# Patient Record
Sex: Female | Born: 1950 | Race: Black or African American | Hispanic: No | Smoking: Never smoker
Health system: Southern US, Community
[De-identification: ages and names within clinical notes are randomized; demographics above are authoritative.]

## PROBLEM LIST (undated history)

## (undated) DIAGNOSIS — I10 Essential (primary) hypertension: Secondary | ICD-10-CM

## (undated) DIAGNOSIS — I161 Hypertensive emergency: Secondary | ICD-10-CM

## (undated) DIAGNOSIS — F419 Anxiety disorder, unspecified: Secondary | ICD-10-CM

## (undated) DIAGNOSIS — F329 Major depressive disorder, single episode, unspecified: Secondary | ICD-10-CM

## (undated) DIAGNOSIS — E119 Type 2 diabetes mellitus without complications: Secondary | ICD-10-CM

## (undated) DIAGNOSIS — H547 Unspecified visual loss: Secondary | ICD-10-CM

## (undated) DIAGNOSIS — F32A Depression, unspecified: Secondary | ICD-10-CM

## (undated) HISTORY — DX: Hypertensive emergency: I16.1

## (undated) HISTORY — DX: Depression, unspecified: F32.A

## (undated) HISTORY — PX: EYE SURGERY: SHX253

## (undated) HISTORY — DX: Unspecified visual loss: H54.7

## (undated) HISTORY — DX: Anxiety disorder, unspecified: F41.9

---
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
## 2013-04-22 DIAGNOSIS — H4311 Vitreous hemorrhage, right eye: Secondary | ICD-10-CM | POA: Insufficient documentation

## 2013-04-22 HISTORY — DX: Vitreous hemorrhage, right eye: H43.11

## 2013-12-31 DIAGNOSIS — E1139 Type 2 diabetes mellitus with other diabetic ophthalmic complication: Secondary | ICD-10-CM

## 2013-12-31 HISTORY — DX: Type 2 diabetes mellitus with other diabetic ophthalmic complication: E11.39

## 2016-06-25 DIAGNOSIS — E113519 Type 2 diabetes mellitus with proliferative diabetic retinopathy with macular edema, unspecified eye: Secondary | ICD-10-CM | POA: Insufficient documentation

## 2017-02-14 DIAGNOSIS — H3341 Traction detachment of retina, right eye: Secondary | ICD-10-CM | POA: Insufficient documentation

## 2017-02-14 HISTORY — DX: Traction detachment of retina, right eye: H33.41

## 2017-05-05 DIAGNOSIS — H2510 Age-related nuclear cataract, unspecified eye: Secondary | ICD-10-CM | POA: Insufficient documentation

## 2017-09-08 DIAGNOSIS — H2510 Age-related nuclear cataract, unspecified eye: Secondary | ICD-10-CM | POA: Insufficient documentation

## 2018-05-21 DIAGNOSIS — E11311 Type 2 diabetes mellitus with unspecified diabetic retinopathy with macular edema: Secondary | ICD-10-CM | POA: Insufficient documentation

## 2019-01-20 ENCOUNTER — Other Ambulatory Visit: Payer: Self-pay

## 2019-01-20 ENCOUNTER — Encounter: Payer: Self-pay | Admitting: Emergency Medicine

## 2019-01-20 ENCOUNTER — Inpatient Hospital Stay
Admission: EM | Admit: 2019-01-20 | Discharge: 2019-01-23 | DRG: 305 | Disposition: A | Discharge status: Home / Self Care | Payer: Medicare Other | Attending: Internal Medicine | Admitting: Internal Medicine

## 2019-01-20 DIAGNOSIS — E119 Type 2 diabetes mellitus without complications: Secondary | ICD-10-CM | POA: Diagnosis not present

## 2019-01-20 DIAGNOSIS — E860 Dehydration: Secondary | ICD-10-CM | POA: Diagnosis present

## 2019-01-20 DIAGNOSIS — I161 Hypertensive emergency: Secondary | ICD-10-CM | POA: Diagnosis not present

## 2019-01-20 DIAGNOSIS — Z79899 Other long term (current) drug therapy: Secondary | ICD-10-CM

## 2019-01-20 DIAGNOSIS — N179 Acute kidney failure, unspecified: Secondary | ICD-10-CM | POA: Diagnosis not present

## 2019-01-20 DIAGNOSIS — E875 Hyperkalemia: Secondary | ICD-10-CM | POA: Diagnosis present

## 2019-01-20 DIAGNOSIS — Z20828 Contact with and (suspected) exposure to other viral communicable diseases: Secondary | ICD-10-CM | POA: Diagnosis present

## 2019-01-20 DIAGNOSIS — I10 Essential (primary) hypertension: Secondary | ICD-10-CM | POA: Diagnosis present

## 2019-01-20 DIAGNOSIS — H547 Unspecified visual loss: Secondary | ICD-10-CM | POA: Diagnosis present

## 2019-01-20 DIAGNOSIS — Z833 Family history of diabetes mellitus: Secondary | ICD-10-CM

## 2019-01-20 DIAGNOSIS — F419 Anxiety disorder, unspecified: Secondary | ICD-10-CM | POA: Diagnosis present

## 2019-01-20 DIAGNOSIS — Z823 Family history of stroke: Secondary | ICD-10-CM

## 2019-01-20 HISTORY — DX: Essential (primary) hypertension: I10

## 2019-01-20 HISTORY — DX: Acute kidney failure, unspecified: N17.9

## 2019-01-20 HISTORY — DX: Type 2 diabetes mellitus without complications: E11.9

## 2019-01-20 LAB — COMPREHENSIVE METABOLIC PANEL
ALT: 8 U/L (ref 0–44)
AST: 17 U/L (ref 15–41)
Albumin: 4.2 g/dL (ref 3.5–5.0)
Alkaline Phosphatase: 63 U/L (ref 38–126)
Anion gap: 11 (ref 5–15)
BUN: 33 mg/dL — ABNORMAL HIGH (ref 8–23)
CO2: 20 mmol/L — ABNORMAL LOW (ref 22–32)
Calcium: 9.6 mg/dL (ref 8.9–10.3)
Chloride: 103 mmol/L (ref 98–111)
Creatinine, Ser: 1.67 mg/dL — ABNORMAL HIGH (ref 0.44–1.00)
GFR calc Af Amer: 37 mL/min — ABNORMAL LOW (ref >60)
GFR calc non Af Amer: 32 mL/min — ABNORMAL LOW (ref >60)
Glucose, Bld: 213 mg/dL — ABNORMAL HIGH (ref 70–99)
Potassium: 7.4 mmol/L (ref 3.5–5.1)
Sodium: 134 mmol/L — ABNORMAL LOW (ref 135–145)
Total Bilirubin: 0.7 mg/dL (ref 0.3–1.2)
Total Protein: 8.1 g/dL (ref 6.5–8.1)

## 2019-01-20 LAB — BASIC METABOLIC PANEL
Anion gap: 10 (ref 5–15)
BUN: 33 mg/dL — ABNORMAL HIGH (ref 8–23)
CO2: 19 mmol/L — ABNORMAL LOW (ref 22–32)
Calcium: 9 mg/dL (ref 8.9–10.3)
Chloride: 106 mmol/L (ref 98–111)
Creatinine, Ser: 1.64 mg/dL — ABNORMAL HIGH (ref 0.44–1.00)
GFR calc Af Amer: 37 mL/min — ABNORMAL LOW (ref >60)
GFR calc non Af Amer: 32 mL/min — ABNORMAL LOW (ref >60)
Glucose, Bld: 180 mg/dL — ABNORMAL HIGH (ref 70–99)
Potassium: 5.5 mmol/L — ABNORMAL HIGH (ref 3.5–5.1)
Sodium: 135 mmol/L (ref 135–145)

## 2019-01-20 LAB — CBC
HCT: 33.9 % — ABNORMAL LOW (ref 36.0–46.0)
Hemoglobin: 11.1 g/dL — ABNORMAL LOW (ref 12.0–15.0)
MCH: 27.7 pg (ref 26.0–34.0)
MCHC: 32.7 g/dL (ref 30.0–36.0)
MCV: 84.5 fL (ref 80.0–100.0)
Platelets: 294 10*3/uL (ref 150–400)
RBC: 4.01 MIL/uL (ref 3.87–5.11)
RDW: 13.3 % (ref 11.5–15.5)
WBC: 8.2 10*3/uL (ref 4.0–10.5)
nRBC: 0 % (ref 0.0–0.2)

## 2019-01-20 LAB — TROPONIN I (HIGH SENSITIVITY): Troponin I (High Sensitivity): 7 ng/L (ref <18)

## 2019-01-20 LAB — SARS CORONAVIRUS 2 (TAT 6-24 HRS): SARS Coronavirus 2: NEGATIVE

## 2019-01-20 MED ORDER — CALCIUM GLUCONATE-NACL 1-0.675 GM/50ML-% IV SOLN
1.0000 g | Freq: Once | INTRAVENOUS | Status: AC
  Administered 2019-01-20: 1000 mg via INTRAVENOUS
  Filled 2019-01-20: qty 50

## 2019-01-20 MED ORDER — DEXTROSE 50 % IV SOLN
25.0000 g | Freq: Once | INTRAVENOUS | Status: AC
  Administered 2019-01-20: 17:00:00 25 g via INTRAVENOUS

## 2019-01-20 MED ORDER — DEXTROSE 50 % IV SOLN
INTRAVENOUS | Status: AC
  Administered 2019-01-20: 25 g via INTRAVENOUS
  Filled 2019-01-20: qty 50

## 2019-01-20 MED ORDER — SODIUM CHLORIDE 0.9 % IV BOLUS
1000.0000 mL | Freq: Once | INTRAVENOUS | Status: AC
  Administered 2019-01-20: 1000 mL via INTRAVENOUS

## 2019-01-20 MED ORDER — LORAZEPAM 1 MG PO TABS
1.0000 mg | ORAL_TABLET | Freq: Once | ORAL | Status: AC
  Administered 2019-01-20: 1 mg via ORAL
  Filled 2019-01-20: qty 1

## 2019-01-20 MED ORDER — SODIUM CHLORIDE 0.9 % IV SOLN
INTRAVENOUS | Status: DC
  Administered 2019-01-20 – 2019-01-21 (×2): via INTRAVENOUS

## 2019-01-20 MED ORDER — SODIUM BICARBONATE 8.4 % IV SOLN
50.0000 meq | Freq: Once | INTRAVENOUS | Status: AC
  Administered 2019-01-20: 50 meq via INTRAVENOUS
  Filled 2019-01-20: qty 50

## 2019-01-20 MED ORDER — HYDRALAZINE HCL 20 MG/ML IJ SOLN
10.0000 mg | Freq: Once | INTRAMUSCULAR | Status: AC
  Administered 2019-01-20: 10 mg via INTRAVENOUS
  Filled 2019-01-20: qty 1

## 2019-01-20 MED ORDER — CLONIDINE HCL 0.1 MG PO TABS
0.2000 mg | ORAL_TABLET | Freq: Once | ORAL | Status: AC
  Administered 2019-01-20: 0.2 mg via ORAL
  Filled 2019-01-20: qty 2

## 2019-01-20 MED ORDER — LABETALOL HCL 5 MG/ML IV SOLN
10.0000 mg | INTRAVENOUS | Status: DC | PRN
  Administered 2019-01-21: 10 mg via INTRAVENOUS
  Filled 2019-01-20 (×2): qty 4

## 2019-01-20 MED ORDER — INSULIN ASPART 100 UNIT/ML ~~LOC~~ SOLN
10.0000 [IU] | Freq: Once | SUBCUTANEOUS | Status: AC
  Administered 2019-01-20: 10 [IU] via INTRAVENOUS
  Filled 2019-01-20: qty 1
  Filled 2019-01-20: qty 0.1

## 2019-01-20 MED ORDER — PATIROMER SORBITEX CALCIUM 8.4 G PO PACK
16.8000 g | PACK | Freq: Every day | ORAL | Status: DC
  Administered 2019-01-20 – 2019-01-22 (×3): 16.8 g via ORAL
  Filled 2019-01-20 (×5): qty 2

## 2019-01-20 MED ORDER — AMLODIPINE BESYLATE 10 MG PO TABS
10.0000 mg | ORAL_TABLET | Freq: Every day | ORAL | Status: DC
  Administered 2019-01-20 – 2019-01-23 (×4): 10 mg via ORAL
  Filled 2019-01-20 (×3): qty 1
  Filled 2019-01-20: qty 2

## 2019-01-20 NOTE — ED Notes (Signed)
Reports hypertension over last week, takes BP at home. Denies blurry vision or dizziness. Pt alert and oriented X4, cooperative, RR even and unlabored, color WNL. Pt in NAD.

## 2019-01-20 NOTE — ED Notes (Signed)
Date and time results received: 01/20/19 4:32 PM 2 (use smartphrase ".now" to insert current time)  Test: potassium Critical Value: 7.4   Name of Provider Notified: Dr. Harvest Dark   Orders Received? Or Actions Taken?:

## 2019-01-20 NOTE — ED Triage Notes (Signed)
Pt in via POV, reports high blood pressure x a few days.  Reports going to PCP a few days ago, BP high then, medications have been changed but without any relief.  BP 226/85 in triage.    Pt also reports intermittent tingling sensation to left abdomen.  NAD noted at this time.    Pt is legally blind; daughter remains with patient in lobby.

## 2019-01-20 NOTE — ED Provider Notes (Signed)
[facility] Emergency Department Provider Note  Time seen: 4:24 PM  I have reviewed the triage vital signs and the nursing notes.   HISTORY  Chief Complaint Hypertension   HPI Jill Larsen is a 68 y.o. female   with a past medical history of diabetes and hypertension presents to the emergency department for high blood pressure.  According to the patient her blood pressure has been elevated at times greater than 200, states she recently saw her doctor who changed her medications but she is not exactly sure what was changed.  Patient states some tingling in her abdomen at times but denies any weakness or numbness of any arm or leg.  Denies any headache.  Denies any chest pain or shortness of breath.  Past Medical History:  Diagnosis Date  . Diabetes mellitus without complication (HCC)   . Hypertension     There are no active problems to display for this patient.   Past Surgical History:  Procedure Laterality Date  . EYE SURGERY      Prior to Admission medications   Not on File    No Known Allergies  No family history on file.  Social History Social History   Tobacco Use  . Smoking status: Never Smoker  . Smokeless tobacco: Never Used  Substance Use Topics  . Alcohol use: Never    Frequency: Never  . Drug use: Never    Review of Systems Constitutional: Negative for fever. Cardiovascular: Negative for chest pain. Respiratory: Negative for shortness of breath. Gastrointestinal: Negative for abdominal pain, vomiting and diarrhea. Musculoskeletal: Negative for musculoskeletal complaints Skin: Negative for skin complaints  Neurological: Negative for headache All other ROS negative  ____________________________________________   PHYSICAL EXAM:  VITAL SIGNS: ED Triage Vitals  Enc Vitals Group     BP 01/20/19 1519 (!) 226/85     Pulse Rate 01/20/19 1519 80     Resp 01/20/19 1519 16     Temp 01/20/19 1519 98.1 F (36.7 C)     Temp  Source 01/20/19 1519 Oral     SpO2 01/20/19 1519 100 %     Weight 01/20/19 1520 213 lb (96.6 kg)     Height 01/20/19 1520 5\' 2"  (1.575 m)     Head Circumference --      Peak Flow --      Pain Score 01/20/19 1519 0     Pain Loc --      Pain Edu? --      Excl. in GC? --    Constitutional: Alert and oriented. Well appearing and in no distress. Eyes: Normal exam ENT      Head: Normocephalic and atraumatic.      Mouth/Throat: Mucous membranes are moist. Cardiovascular: Normal rate, regular rhythm.  Respiratory: Normal respiratory effort without tachypnea nor retractions. Breath sounds are clear  Gastrointestinal: Soft and nontender. No distention.   Musculoskeletal: Nontender with normal range of motion in all extremities.  Neurologic:  Normal speech and language. No gross focal neurologic deficits  Skin:  Skin is warm, dry and intact.  Psychiatric: Mood and affect are normal.  Mildly anxious appearing.  ____________________________________________    EKG  EKG viewed and interpreted by myself shows a normal sinus rhythm at 73 bpm with a narrow QRS, normal axis, normal intervals, no concerning ST changes.  ____________________________________________   INITIAL IMPRESSION / ASSESSMENT AND PLAN / ED COURSE  Pertinent labs & imaging results that were available during my care of the patient were  reviewed by me and considered in my medical decision making (see chart for details).   Patient presents to the emergency department for hypertension, found to have a blood pressure of 081 systolic upon arrival.  Patient states her doctor has made some recent medication changes.  It appears that the patient has been placed on spironolactone.  Which could be a cause of the patient's hyperkalemia.  Potassium currently of 7.4.  No significant EKG changes.  I placed the patient on insulin, glucose, normal saline bolus as well as Veltassa.  We will admit to the hospitalist service for further treatment.   Patient agreeable to plan of care.  I also treated the patient's blood pressure with an additional 0.2 mg of clonidine which the patient already takes twice daily as well as 1 mg of Ativan as the patient does appear quite anxious regarding her blood pressure.  [name] was evaluated in Emergency Department on 01/20/2019 for the symptoms described in the history of present illness. She was evaluated in the context of the global COVID-19 pandemic, which necessitated consideration that the patient might be at risk for infection with the SARS-CoV-2 virus that causes COVID-19. Institutional protocols and algorithms that pertain to the evaluation of patients at risk for COVID-19 are in a state of rapid change based on information released by regulatory bodies including the CDC and federal and state organizations. These policies and algorithms were followed during the patient's care in the ED.  CRITICAL CARE Performed by: Harvest Dark   Total critical care time: 30 minutes  Critical care time was exclusive of separately billable procedures and treating other patients.  Critical care was necessary to treat or prevent imminent or life-threatening deterioration.  Critical care was time spent personally by me on the following activities: development of treatment plan with patient and/or surrogate as well as nursing, discussions with consultants, evaluation of patient's response to treatment, examination of patient, obtaining history from patient or surrogate, ordering and performing treatments and interventions, ordering and review of laboratory studies, ordering and review of radiographic studies, pulse oximetry and re-evaluation of patient's condition.  ____________________________________________   FINAL CLINICAL IMPRESSION(S) / ED DIAGNOSES  Hypertension Hyperkalemia   Harvest Dark, MD 01/20/19 1707

## 2019-01-20 NOTE — H&P (Addendum)
History and Physical    Jill RowanMary Tith ZOX:[id number] DOB: [birth year] DOA: 01/20/2019  Referring MD/NP/PA:   PCP: Patient, No Pcp Per   Patient coming from:  The patient is coming from home.  At baseline, pt is independent for most of ADL.        Chief Complaint: Elevated blood pressure  HPI: Jill Larsen is a 68 y.o. female with medical history significant of hypertension, diabetes mellitus, poor vision, who presents with elevated blood pressure.  Patient states that she has been having elevated blood pressure for more than 1 week.  Her PCP has adjusted her blood pressure medications recently, currently she is taking losartan 40 mg daily, Coreg 6.25 mg twice daily, clonidine 0.2 mg twice daily, spironolactone 50 mg daily.  Bp is still elevated, 226/90 today.  Patient states that she had some numbness in the left side of the abdomen earlier, which has resolved.  No unilateral weakness, numbness or tingling to extremities.  Patient denies any chest pain, shortness breath, cough.  No nausea vomiting, diarrhea, abdominal pain, symptoms of UTI. No facial droop or slurred speech  ED Course: pt was found to have blood pressure 226/90, WBC 8.2, pending COVID-19 test, potassium 7.4 without T wave peaking, AKI with creatinine 1.67, BUN 33, temperature normal, heart rate 73, oxygen saturation 100% on room air.  Patient is placed on telemetry bed for observation.  Review of Systems:   General: no fevers, chills, no body weight gain, has fatigue HEENT: no blurry vision, hearing changes or sore throat Respiratory: no dyspnea, coughing, wheezing CV: no chest pain, no palpitations GI: no nausea, vomiting, abdominal pain, diarrhea, constipation GU: no dysuria, burning on urination, increased urinary frequency, hematuria  Ext: no leg edema Neuro: no unilateral weakness, numbness, or tingling, no hearing loss. Has poor vision. Skin: no rash, no skin tear. MSK: No muscle spasm, no deformity, no limitation of  range of movement in spin Heme: No easy bruising.  Travel history: No recent long distant travel.  Allergy: No Known Allergies  Past Medical History:  Diagnosis Date  . Diabetes mellitus without complication (HCC)   . Hypertension     Past Surgical History:  Procedure Laterality Date  . EYE SURGERY      Social History:  reports that she has never smoked. She has never used smokeless tobacco. She reports that she does not drink alcohol or use drugs.  Family History:  Family History  Problem Relation Age of Onset  . Stroke Mother   . Diabetes type II Mother   . Stroke Father   . Diabetes Mellitus II Brother      Prior to Admission medications   Not on File    Physical Exam: Vitals:   01/20/19 1621 01/20/19 1730 01/20/19 1759 01/20/19 1821  BP: (!) 199/77 (!) 183/71 (!) 175/81 (!) 153/64  Pulse:  69 72 74  Resp:  20 20 20   Temp:      TempSrc:      SpO2:  100% 100% 100%  Weight:      Height:       General: Not in acute distress HEENT:       Eyes: PERRL, EOMI, no scleral icterus.       ENT: No discharge from the ears and nose, no pharynx injection, no tonsillar enlargement.        Neck: No JVD, no bruit, no mass felt. Heme: No neck lymph node enlargement. Cardiac: S1/S2, RRR, No murmurs, No gallops or rubs. Respiratory:  No rales, wheezing, rhonchi or rubs. GI: Soft, nondistended, nontender, no rebound pain, no organomegaly, BS present. GU: No hematuria Ext: No pitting leg edema bilaterally. 2+DP/PT pulse bilaterally. Musculoskeletal: No joint deformities, No joint redness or warmth, no limitation of ROM in spin. Skin: No rashes.  Neuro: Alert, oriented X3, cranial nerves II-XII grossly intact, moves all extremities normally.  Psych: Patient is not psychotic, no suicidal or hemocidal ideation.  Labs on Admission: I have personally reviewed following labs and imaging studies  CBC: Recent Labs  Lab 01/20/19 1532  WBC 8.2  HGB 11.1*  HCT 33.9*  MCV 84.5   PLT 294   Basic Metabolic Panel: Recent Labs  Lab 01/20/19 1532  NA 134*  K 7.4*  CL 103  CO2 20*  GLUCOSE 213*  BUN 33*  CREATININE 1.67*  CALCIUM 9.6   GFR: Estimated Creatinine Clearance: 35.9 mL/min (A) (by C-G formula based on SCr of 1.67 mg/dL (H)). Liver Function Tests: Recent Labs  Lab 01/20/19 1532  AST 17  ALT 8  ALKPHOS 63  BILITOT 0.7  PROT 8.1  ALBUMIN 4.2   No results for input(s): LIPASE, AMYLASE in the last 168 hours. No results for input(s): AMMONIA in the last 168 hours. Coagulation Profile: No results for input(s): INR, PROTIME in the last 168 hours. Cardiac Enzymes: No results for input(s): CKTOTAL, CKMB, CKMBINDEX, TROPONINI in the last 168 hours. BNP (last 3 results) No results for input(s): PROBNP in the last 8760 hours. HbA1C: No results for input(s): HGBA1C in the last 72 hours. CBG: No results for input(s): GLUCAP in the last 168 hours. Lipid Profile: No results for input(s): CHOL, HDL, LDLCALC, TRIG, CHOLHDL, LDLDIRECT in the last 72 hours. Thyroid Function Tests: No results for input(s): TSH, T4TOTAL, FREET4, T3FREE, THYROIDAB in the last 72 hours. Anemia Panel: No results for input(s): VITAMINB12, FOLATE, FERRITIN, TIBC, IRON, RETICCTPCT in the last 72 hours. Urine analysis: No results found for: COLORURINE, APPEARANCEUR, LABSPEC, PHURINE, GLUCOSEU, HGBUR, BILIRUBINUR, KETONESUR, PROTEINUR, UROBILINOGEN, NITRITE, LEUKOCYTESUR Sepsis Labs: @LABRCNTIP (procalcitonin:4,lacticidven:4) )No results found for this or any previous visit (from the past 240 hour(s)).   Radiological Exams on Admission: No results found.   EKG: Independently reviewed.  Sinus rhythm, QTC 412, LAE, LAD, poor R wave progression, T wave inversion only in lead III.  Assessment/Plan Principal Problem:   Hypertensive emergency Active Problems:   AKI (acute kidney injury) (HCC)   Diabetes mellitus without complication (HCC)   Hypertension   Hyperkalemia    Hypertensive emergency and hx of HTN: bp is 228/90 with AKI.  No CP. Had some left-sided abdominal tingling earlier, which is resolved.  Currently no focal neurological findings. -Will place on tele bed for obs - start amlodpine 10 mg daily - prn IV labetalol -Clonidine 0.2 mg twice daily  -Hold losartan, spironolactone due to AKI -goal of bp reduction is by about 25 to 30% in first several hours, at SBP 160 to 180 mmHg. - check UDS  AKI (acute kidney injury) [facility]): Cre 1.67 and BUN 33, likely due to hypertensive emergency, and dehydration and continuation of losartan and spironolactone -IV fluid: 1 L normal saline in ED, then 75 cc/h -Hold losartan, spironolactone  Diabetes mellitus without complication (HCC): Last A1c not on record. Patient is taking Levemir and Metformin at home -will decrease Levemir dose from   -SSI -Check A1c  Hyperkalemia: K 7.4. No T wave peaking on EKG. -treated with D50, 10 unit of Novolog by IV, 1g of calcium chloride, 50  mEq of sodium bicarbonate plus  16.8 g of Pariromer -f/u by BMP at 22:00 and BMP in AM. PA Ouma on call will f/u BMP   DVT ppx: SQ Lovenox Code Status: Full code Family Communication: None at bed side.     Disposition Plan:  Anticipate discharge back to previous home environment Consults called:  none Admission status: Obs / tele       Date of Service 01/20/2019    Ivor Costa [facility]   If 7PM-7AM, please contact night-coverage [email or web address] Password [facility] 01/20/2019, 8:29 PM

## 2019-01-21 ENCOUNTER — Other Ambulatory Visit: Payer: Self-pay

## 2019-01-21 DIAGNOSIS — F419 Anxiety disorder, unspecified: Secondary | ICD-10-CM | POA: Diagnosis present

## 2019-01-21 DIAGNOSIS — Z833 Family history of diabetes mellitus: Secondary | ICD-10-CM | POA: Diagnosis not present

## 2019-01-21 DIAGNOSIS — Z79899 Other long term (current) drug therapy: Secondary | ICD-10-CM | POA: Diagnosis not present

## 2019-01-21 DIAGNOSIS — E119 Type 2 diabetes mellitus without complications: Secondary | ICD-10-CM | POA: Diagnosis present

## 2019-01-21 DIAGNOSIS — I161 Hypertensive emergency: Secondary | ICD-10-CM | POA: Diagnosis present

## 2019-01-21 DIAGNOSIS — Z20828 Contact with and (suspected) exposure to other viral communicable diseases: Secondary | ICD-10-CM | POA: Diagnosis present

## 2019-01-21 DIAGNOSIS — E860 Dehydration: Secondary | ICD-10-CM | POA: Diagnosis present

## 2019-01-21 DIAGNOSIS — H547 Unspecified visual loss: Secondary | ICD-10-CM | POA: Diagnosis present

## 2019-01-21 DIAGNOSIS — Z823 Family history of stroke: Secondary | ICD-10-CM | POA: Diagnosis not present

## 2019-01-21 DIAGNOSIS — E875 Hyperkalemia: Secondary | ICD-10-CM | POA: Diagnosis present

## 2019-01-21 DIAGNOSIS — N179 Acute kidney failure, unspecified: Secondary | ICD-10-CM | POA: Diagnosis present

## 2019-01-21 DIAGNOSIS — I10 Essential (primary) hypertension: Secondary | ICD-10-CM | POA: Diagnosis present

## 2019-01-21 LAB — HEMOGLOBIN A1C
Hgb A1c MFr Bld: 9.5 % — ABNORMAL HIGH (ref 4.8–5.6)
Mean Plasma Glucose: 225.95 mg/dL

## 2019-01-21 LAB — URINE DRUG SCREEN, QUALITATIVE (ARMC ONLY)
Amphetamines, Ur Screen: NOT DETECTED
Barbiturates, Ur Screen: NOT DETECTED
Benzodiazepine, Ur Scrn: NOT DETECTED
Cannabinoid 50 Ng, Ur ~~LOC~~: NOT DETECTED
Cocaine Metabolite,Ur ~~LOC~~: NOT DETECTED
MDMA (Ecstasy)Ur Screen: NOT DETECTED
Methadone Scn, Ur: NOT DETECTED
Opiate, Ur Screen: NOT DETECTED
Phencyclidine (PCP) Ur S: NOT DETECTED
Tricyclic, Ur Screen: NOT DETECTED

## 2019-01-21 LAB — BASIC METABOLIC PANEL
Anion gap: 9 (ref 5–15)
BUN: 28 mg/dL — ABNORMAL HIGH (ref 8–23)
CO2: 23 mmol/L (ref 22–32)
Calcium: 9.5 mg/dL (ref 8.9–10.3)
Chloride: 104 mmol/L (ref 98–111)
Creatinine, Ser: 1.36 mg/dL — ABNORMAL HIGH (ref 0.44–1.00)
GFR calc Af Amer: 47 mL/min — ABNORMAL LOW (ref >60)
GFR calc non Af Amer: 40 mL/min — ABNORMAL LOW (ref >60)
Glucose, Bld: 191 mg/dL — ABNORMAL HIGH (ref 70–99)
Potassium: 5 mmol/L (ref 3.5–5.1)
Sodium: 136 mmol/L (ref 135–145)

## 2019-01-21 LAB — GLUCOSE, CAPILLARY
Glucose-Capillary: 154 mg/dL — ABNORMAL HIGH (ref 70–99)
Glucose-Capillary: 159 mg/dL — ABNORMAL HIGH (ref 70–99)
Glucose-Capillary: 171 mg/dL — ABNORMAL HIGH (ref 70–99)
Glucose-Capillary: 147 mg/dL — ABNORMAL HIGH (ref 70–99)
Glucose-Capillary: 159 mg/dL — ABNORMAL HIGH (ref 70–99)
Glucose-Capillary: 201 mg/dL — ABNORMAL HIGH (ref 70–99)

## 2019-01-21 LAB — CBC
HCT: 33 % — ABNORMAL LOW (ref 36.0–46.0)
Hemoglobin: 10.9 g/dL — ABNORMAL LOW (ref 12.0–15.0)
MCH: 27.7 pg (ref 26.0–34.0)
MCHC: 33 g/dL (ref 30.0–36.0)
MCV: 83.8 fL (ref 80.0–100.0)
Platelets: 256 10*3/uL (ref 150–400)
RBC: 3.94 MIL/uL (ref 3.87–5.11)
RDW: 13.3 % (ref 11.5–15.5)
WBC: 7.3 10*3/uL (ref 4.0–10.5)
nRBC: 0 % (ref 0.0–0.2)

## 2019-01-21 LAB — HIV ANTIBODY (ROUTINE TESTING W REFLEX): HIV Screen 4th Generation wRfx: NONREACTIVE

## 2019-01-21 MED ORDER — ONDANSETRON HCL 4 MG PO TABS: 4.0000 mg | ORAL_TABLET | Freq: Four times a day (QID) | ORAL | Status: DC | PRN

## 2019-01-21 MED ORDER — CLONIDINE HCL 0.1 MG PO TABS
0.2000 mg | ORAL_TABLET | Freq: Two times a day (BID) | ORAL | Status: DC
  Administered 2019-01-21 – 2019-01-23 (×5): 0.2 mg via ORAL
  Filled 2019-01-21 (×5): qty 2

## 2019-01-21 MED ORDER — HYDROXYZINE HCL 10 MG PO TABS
10.0000 mg | ORAL_TABLET | Freq: Three times a day (TID) | ORAL | Status: DC | PRN
  Administered 2019-01-21 – 2019-01-23 (×3): 10 mg via ORAL
  Filled 2019-01-21 (×4): qty 1

## 2019-01-21 MED ORDER — LABETALOL HCL 5 MG/ML IV SOLN
5.0000 mg | Freq: Once | INTRAVENOUS | Status: AC
  Administered 2019-01-21: 5 mg via INTRAVENOUS
  Filled 2019-01-21: qty 4

## 2019-01-21 MED ORDER — ENOXAPARIN SODIUM 40 MG/0.4ML ~~LOC~~ SOLN
40.0000 mg | SUBCUTANEOUS | Status: DC
  Administered 2019-01-21 – 2019-01-23 (×3): 40 mg via SUBCUTANEOUS
  Filled 2019-01-21 (×3): qty 0.4

## 2019-01-21 MED ORDER — ACETAMINOPHEN 325 MG PO TABS
650.0000 mg | ORAL_TABLET | Freq: Four times a day (QID) | ORAL | Status: DC | PRN
  Administered 2019-01-21: 650 mg via ORAL
  Filled 2019-01-21: qty 2

## 2019-01-21 MED ORDER — ONDANSETRON HCL 4 MG/2ML IJ SOLN: 4.0000 mg | Freq: Four times a day (QID) | INTRAMUSCULAR | Status: DC | PRN

## 2019-01-21 MED ORDER — ACETAMINOPHEN 650 MG RE SUPP: 650.0000 mg | Freq: Four times a day (QID) | RECTAL | Status: DC | PRN

## 2019-01-21 MED ORDER — INSULIN DETEMIR 100 UNIT/ML ~~LOC~~ SOLN: 25.0000 [IU] | Freq: Every day | SUBCUTANEOUS | Status: DC

## 2019-01-21 MED ORDER — INSULIN ASPART 100 UNIT/ML ~~LOC~~ SOLN
0.0000 [IU] | Freq: Three times a day (TID) | SUBCUTANEOUS | Status: DC
  Administered 2019-01-21: 1 [IU] via SUBCUTANEOUS
  Administered 2019-01-21: 2 [IU] via SUBCUTANEOUS
  Administered 2019-01-22 (×2): 1 [IU] via SUBCUTANEOUS
  Administered 2019-01-22: 3 [IU] via SUBCUTANEOUS
  Filled 2019-01-21 (×5): qty 1

## 2019-01-21 MED ORDER — INSULIN ASPART 100 UNIT/ML ~~LOC~~ SOLN
0.0000 [IU] | SUBCUTANEOUS | Status: DC
  Administered 2019-01-21: 2 [IU] via SUBCUTANEOUS
  Filled 2019-01-21: qty 1

## 2019-01-21 MED ORDER — INSULIN ASPART 100 UNIT/ML ~~LOC~~ SOLN: 0.0000 [IU] | Freq: Three times a day (TID) | SUBCUTANEOUS | Status: DC

## 2019-01-21 MED ORDER — INSULIN ASPART 100 UNIT/ML ~~LOC~~ SOLN: 0.0000 [IU] | Freq: Every day | SUBCUTANEOUS | Status: DC

## 2019-01-21 MED ORDER — HYDRALAZINE HCL 50 MG PO TABS
50.0000 mg | ORAL_TABLET | Freq: Three times a day (TID) | ORAL | Status: DC
  Administered 2019-01-21 – 2019-01-23 (×6): 50 mg via ORAL
  Filled 2019-01-21 (×6): qty 1

## 2019-01-21 MED ORDER — PREDNISOLONE ACETATE 1 % OP SUSP
1.0000 [drp] | Freq: Three times a day (TID) | OPHTHALMIC | Status: DC
  Administered 2019-01-21 – 2019-01-23 (×7): 1 [drp] via OPHTHALMIC
  Filled 2019-01-21: qty 1

## 2019-01-21 MED ORDER — HYDRALAZINE HCL 50 MG PO TABS: 50.0000 mg | ORAL_TABLET | Freq: Three times a day (TID) | ORAL | Status: DC

## 2019-01-21 MED ORDER — INSULIN ASPART 100 UNIT/ML ~~LOC~~ SOLN
0.0000 [IU] | Freq: Every day | SUBCUTANEOUS | Status: DC
  Administered 2019-01-21: 2 [IU] via SUBCUTANEOUS
  Filled 2019-01-21: qty 1

## 2019-01-21 MED ORDER — CARVEDILOL 6.25 MG PO TABS
6.2500 mg | ORAL_TABLET | Freq: Two times a day (BID) | ORAL | Status: DC
  Administered 2019-01-21 – 2019-01-23 (×5): 6.25 mg via ORAL
  Filled 2019-01-21 (×5): qty 1

## 2019-01-21 MED ORDER — BRIMONIDINE TARTRATE 0.2 % OP SOLN
1.0000 [drp] | Freq: Two times a day (BID) | OPHTHALMIC | Status: DC
  Administered 2019-01-21 – 2019-01-23 (×5): 1 [drp] via OPHTHALMIC
  Filled 2019-01-21: qty 5

## 2019-01-21 MED ORDER — CITALOPRAM HYDROBROMIDE 20 MG PO TABS
20.0000 mg | ORAL_TABLET | Freq: Every day | ORAL | Status: DC
  Administered 2019-01-21 – 2019-01-23 (×3): 20 mg via ORAL
  Filled 2019-01-21 (×3): qty 1

## 2019-01-21 MED ORDER — INSULIN DETEMIR 100 UNIT/ML ~~LOC~~ SOLN
18.0000 [IU] | Freq: Two times a day (BID) | SUBCUTANEOUS | Status: DC
  Administered 2019-01-21 – 2019-01-23 (×5): 18 [IU] via SUBCUTANEOUS
  Filled 2019-01-21 (×6): qty 0.18

## 2019-01-21 NOTE — TOC Initial Note (Signed)
Transition of Care [facility]) - Initial/Assessment Note    Patient Details  Name: Okla Qazi MRN: [id number] Date of Birth: 08-Oct-1950  [facility]) CM/SW Contact:    Candie Chroman, LCSW Phone Number: 01/21/2019, 9:54 AM  Clinical Narrative:  Patient did not have a PCP prior to admission. No preference of provider. She has an appt with Delsa Grana, PA-C at [facility] on 11/19 at 1:20 pm. Information put on AVS. Patient had no home health prior to admission and does not use any DME to get around. Her daughter drives her when needed. Per MD, patient will likely discharge home today.               Expected Discharge Plan: Home/Self Care     Patient Goals and CMS Choice     Choice offered to / list presented to : NA  Expected Discharge Plan and Services Expected Discharge Plan: Home/Self Care     Post Acute Care Choice: NA Living arrangements for the past 2 months: Single Family Home                                      Prior Living Arrangements/Services Living arrangements for the past 2 months: Single Family Home   Patient language and need for interpreter reviewed:: Yes Do you feel safe going back to the place where you live?: Yes      Need for Family Participation in Patient Care: No (Comment) Care giver support system in place?: No (comment)   Criminal Activity/Legal Involvement Pertinent to Current Situation/Hospitalization: No - Comment as needed  Activities of Daily Living Home Assistive Devices/Equipment: CBG Meter ADL Screening (condition at time of admission) Patient's cognitive ability adequate to safely complete daily activities?: Yes Is the patient deaf or have difficulty hearing?: No Does the patient have difficulty seeing, even when wearing glasses/contacts?: Yes Does the patient have difficulty concentrating, remembering, or making decisions?: No Patient able to express need for assistance with ADLs?: Yes Does the  patient have difficulty dressing or bathing?: No Independently performs ADLs?: Yes (appropriate for developmental age) Does the patient have difficulty walking or climbing stairs?: No Weakness of Legs: None Weakness of Arms/Hands: None  Permission Sought/Granted                  Emotional Assessment Appearance:: Appears stated age Attitude/Demeanor/Rapport: Engaged, Gracious Affect (typically observed): Accepting, Appropriate, Calm, Pleasant Orientation: : Oriented to Self, Oriented to Place, Oriented to  Time, Oriented to Situation Alcohol / Substance Use: Never Used Psych Involvement: No (comment)  Admission diagnosis:  Hyperkalemia [E87.5] Hypertension, unspecified type [I10] Patient Active Problem List   Diagnosis Date Noted  . AKI (acute kidney injury) ([place]) 01/20/2019  . Diabetes mellitus without complication ([place])   . Hypertension   . Hypertensive emergency   . Hyperkalemia    PCP:  Patient, No Pcp Per Pharmacy:   [facility] [address] [place] [address] Phone: [phone] Fax: [phone]     Social Determinants of Health (SDOH) Interventions    Readmission Risk Interventions No flowsheet data found.

## 2019-01-21 NOTE — Progress Notes (Signed)
PROGRESS NOTE    Jill Larsen  LMB:[id number] DOB: 12-03-1950 DOA: 01/20/2019 PCP: Patient, No Pcp Per    Brief Narrative:   Jill Larsen is a 68 y.o. female with medical history significant of hypertension, diabetes mellitus, poor vision, who presents with elevated blood pressure.  Patient states that she has been having elevated blood pressure for more than 1 week.  Her PCP has adjusted her blood pressure medications recently, currently she is taking losartan 40 mg daily, Coreg 6.25 mg twice daily, clonidine 0.2 mg twice daily, spironolactone 50 mg daily.  Bp is still elevated, 226/90 today.  Patient states that she had some numbness in the left side of the abdomen earlier, which has resolved.  No unilateral weakness, numbness or tingling to extremities.  Patient denies any chest pain, shortness breath, cough.  No nausea vomiting, diarrhea, abdominal pain, symptoms of UTI. No facial droop or slurred speech  ED Course: pt was found to have blood pressure 226/90, WBC 8.2, pending COVID-19 test, potassium 7.4 without T wave peaking, AKI with creatinine 1.67, BUN 33, temperature normal, heart rate 73, oxygen saturation 100% on room air.  Patient is placed on telemetry bed for observation.   Interim History: 1.  Bp improved to 156/49 in the morning, but  later on blood pressure increased 190/73.  Patient denies chest pain or shortness of breath.  Patient has anxiety. Added oral hydralazine 50 mg 3 times daily in addition to amlodipine, Coreg, clonidine and IV as needed labetalol   Assessment & Plan:   Principal Problem:   Hypertensive emergency Active Problems:   AKI (acute kidney injury) (HCC)   Diabetes mellitus without complication (HCC)   Hypertension   Hyperkalemia  Hypertensive emergency and hx of HTN: bp is 228/90 with AKI. Bp improved to 156/49 in the morning, but later on her blood increase it to 1 8 increased 190/73. UDS negative.   No CP or SOB. Nofocal neurological  findings. - will change to inpt since pt cannot d/c'ed today.  - start amlodpine 10 mg daily - coreg 6.25 mg bid - will add hydralazine 50 mg 3 times daily - prn IV labetalol - Clonidine 0.2 mg twice daily  -Hold losartan, spironolactone due to AKI  AKI (acute kidney injury) [facility]): Cre 1.67 -->1.36 and BUN 33 -->28, likely due to hypertensive emergency, and dehydration and continuation of losartan and spironolactone -IV fluid: 1 L normal saline in ED, then 75 cc/h -Hold losartan, spironolactone  Diabetes mellitus without complication (HCC): Last A1c 9.5 on 01/21/19. Patient is taking Levemir and Metformin at home -will decrease Levemir dose from  25 to 18 units daily -SSI  Hyperkalemia: resolved.  K 7.4 -->5.0. Treated with D50, 10 unit of Novolog by IV, 1g of calcium chloride, 50 mEq of sodium bicarbonate plus  16.8 g of Pariromer -f/u by BMP   DVT prophylaxis: lonenox Code Status: full Family Communication: none Disposition Plan: planned to d/c home today, but her blood pressure in creased to 190/73 in the morning, indicating her blood pressure is not stabilized.  We need more time to adjust her blood pressure medications before patient can be discharged.     Consultants:   none  Procedures:  none  Antimicrobials:    Subjective:  Patient feels better, no chest pain, shortness of breath, fever or chills.  No nausea vomiting, diarrhea or abdominal pain.  No unilateral weakness or numbness in the extremities.  Patient has anxiety.  Objective: Vitals:   01/21/19  0805 01/21/19 0947 01/21/19 1233 01/21/19 1450  BP: (!) 213/93 (!) 190/73 (!) 184/74 (!) 179/76  Pulse: 92     Resp: 18     Temp: 98.5 F (36.9 C)     TempSrc: Oral     SpO2: 100%     Weight:      Height:        Intake/Output Summary (Last 24 hours) at 01/21/2019 1544 Last data filed at 01/21/2019 1300 Gross per 24 hour  Intake -  Output 2200 ml  Net -2200 ml   Filed Weights   01/20/19 1520  01/21/19 0231  Weight: 96.6 kg 96.3 kg    Examination: Physical Exam:  General: Not in acute distress HEENT: PERRL, EOMI, no scleral icterus, No JVD or bruit Cardiac: S1/S2, RRR, No murmurs, gallops or rubs Pulm:  Clear to auscultation bilaterally. No rales, wheezing, rhonchi or rubs. Abd: Soft, nondistended, nontender, no rebound pain, no organomegaly, BS present Ext: No edema. 2+DP/PT pulse bilaterally Musculoskeletal: No joint deformities, erythema, or stiffness, ROM full Skin: No rashes.  Neuro: Alert and oriented X3, cranial nerves II-XII grossly intact, muscle strength 5/5 in all extremeties, sensation to light touch intact. Psych: Patient is not psychotic, no suicidal or hemocidal ideation.    Data Reviewed: I have personally reviewed following labs and imaging studies  CBC: Recent Labs  Lab 01/20/19 1532 01/21/19 0523  WBC 8.2 7.3  HGB 11.1* 10.9*  HCT 33.9* 33.0*  MCV 84.5 83.8  PLT 294 694   Basic Metabolic Panel: Recent Labs  Lab 01/20/19 1532 01/20/19 2311 01/21/19 0523  NA 134* 135 136  K 7.4* 5.5* 5.0  CL 103 106 104  CO2 20* 19* 23  GLUCOSE 213* 180* 191*  BUN 33* 33* 28*  CREATININE 1.67* 1.64* 1.36*  CALCIUM 9.6 9.0 9.5   GFR: Estimated Creatinine Clearance: 44.1 mL/min (A) (by C-G formula based on SCr of 1.36 mg/dL (H)). Liver Function Tests: Recent Labs  Lab 01/20/19 1532  AST 17  ALT 8  ALKPHOS 63  BILITOT 0.7  PROT 8.1  ALBUMIN 4.2   No results for input(s): LIPASE, AMYLASE in the last 168 hours. No results for input(s): AMMONIA in the last 168 hours. Coagulation Profile: No results for input(s): INR, PROTIME in the last 168 hours. Cardiac Enzymes: No results for input(s): CKTOTAL, CKMB, CKMBINDEX, TROPONINI in the last 168 hours. BNP (last 3 results) No results for input(s): PROBNP in the last 8760 hours. HbA1C: Recent Labs    01/21/19 0526  HGBA1C 9.5*   CBG: Recent Labs  Lab 01/21/19 0231 01/21/19 0540 01/21/19  0759 01/21/19 1217  GLUCAP 154* 159* 171* 147*   Lipid Profile: No results for input(s): CHOL, HDL, LDLCALC, TRIG, CHOLHDL, LDLDIRECT in the last 72 hours. Thyroid Function Tests: No results for input(s): TSH, T4TOTAL, FREET4, T3FREE, THYROIDAB in the last 72 hours. Anemia Panel: No results for input(s): VITAMINB12, FOLATE, FERRITIN, TIBC, IRON, RETICCTPCT in the last 72 hours. Sepsis Labs: No results for input(s): PROCALCITON, LATICACIDVEN in the last 168 hours.  Recent Results (from the past 240 hour(s))  SARS CORONAVIRUS 2 (TAT 6-24 HRS) Nasopharyngeal Nasopharyngeal Swab     Status: None   Collection Time: 01/20/19  4:48 PM   Specimen: Nasopharyngeal Swab  Result Value Ref Range Status   SARS Coronavirus 2 NEGATIVE NEGATIVE Final    Comment: (NOTE) SARS-CoV-2 target nucleic acids are NOT DETECTED. The SARS-CoV-2 RNA is generally detectable in upper and lower respiratory specimens during the  acute phase of infection. Negative results do not preclude SARS-CoV-2 infection, do not rule out co-infections with other pathogens, and should not be used as the sole basis for treatment or other patient management decisions. Negative results must be combined with clinical observations, patient history, and epidemiological information. The expected result is Negative. Fact Sheet for Patients: HairSlick.nohttps://www.fda.gov/media/138098/download Fact Sheet for Healthcare Providers: quierodirigir.comhttps://www.fda.gov/media/138095/download This test is not yet approved or cleared by the [place] FDA and  has been authorized for detection and/or diagnosis of SARS-CoV-2 by FDA under an Emergency Use Authorization (EUA). This EUA will remain  in effect (meaning this test can be used) for the duration of the COVID-19 declaration under Section 56 4(b)(1) of the Act, 21 U.S.C. section 360bbb-3(b)(1), unless the authorization is terminated or revoked sooner. Performed at [facility], [address]  [place], [place] [address]       Radiology Studies: No results found.      Scheduled Meds: . amLODipine  10 mg Oral Daily  . brimonidine  1 drop Right Eye BID  . carvedilol  6.25 mg Oral BID  . citalopram  20 mg Oral Daily  . cloNIDine  0.2 mg Oral BID  . enoxaparin (LOVENOX) injection  40 mg Subcutaneous Q24H  . hydrALAZINE  50 mg Oral Q8H  . insulin aspart  0-5 Units Subcutaneous QHS  . insulin aspart  0-9 Units Subcutaneous TID WC  . insulin detemir  18 Units Subcutaneous BID  . patiromer  16.8 g Oral Daily  . prednisoLONE acetate  1 drop Right Eye TID   Continuous Infusions: . sodium chloride 75 mL/hr at 01/21/19 0258     LOS: 0 days    Time spent: 30 min    Lorretta HarpXilin Avory Mimbs, DO [facility] PAGER is on AMION  If 7PM-7AM, please contact night-coverage [email or web address] Password [email or web address] 01/21/2019, 3:44 PM

## 2019-01-21 NOTE — Progress Notes (Signed)
Inpatient Diabetes Program Recommendations  AACE/ADA: New Consensus Statement on Inpatient Glycemic Control   Target Ranges:  Prepandial:   less than 140 mg/dL      Peak postprandial:   less than 180 mg/dL (1-2 hours)      Critically ill patients:  140 - 180 mg/dL   Results for Jill Larsen, Jill Larsen (MRN [id number]) as of 01/21/2019 10:48  Ref. Range 01/21/2019 02:31 01/21/2019 05:40 01/21/2019 07:59  Glucose-Capillary Latest Ref Range: 70 - 99 mg/dL 154 (H) 159 (H) 171 (H)  Results for Jill Larsen, Jill Larsen (MRN [id number]) as of 01/21/2019 10:48  Ref. Range 01/21/2019 05:26  Hemoglobin A1C Latest Ref Range: 4.8 - 5.6 % 9.5 (H)   Review of Glycemic Control  Diabetes history: DM2 Outpatient Diabetes medications: Levemir 25 units BID, Metformin 1000 mg BID Current orders for Inpatient glycemic control: Levemir 18 units BID, Novolog 0-9 units TID with meals, Novolog 0-5 units QHS  Inpatient Diabetes Program Recommendations:   HbgA1C: A1C 9.5% on 01/21/19 indicating an average glucose of 229 mg/dl over the past 2-3 months.  NOTE: Noted consult for Diabetes Coordinator for elevated A1C. Spoke with patient over the phone about diabetes and home regimen for diabetes control. Patient reports taking Levemir 25 units BID and Metformin 1000 mg BID as an outpatient for diabetes control. Patient reports taking DM medications consistently as prescribed.  Patient currently does not have PCP and per CM note today, CM made her an appointment with [facility] for 11/19 at 1:20 pm. Informed patient of appointment noted in chart and patient stated "Good, that will work."  Patient reports checking glucose 1 time per day in the mornings and she reports that her glucose is usually 90-mid 100's mg/dl in the mornings.  Patient reports last A1C value was 7%. Discussed A1C results (9.5% on 01/21/19 ) and explained that current A1C indicates an average glucose of 229 mg/dl over the past 2-3 months. Discussed glucose and A1C goals.  Discussed importance of checking CBGs and maintaining good CBG control to prevent long-term and short-term complications. Explained how hyperglycemia leads to damage within blood vessels which lead to the common complications seen with uncontrolled diabetes. Stressed to the patient the importance of improving glycemic control to prevent further complications from uncontrolled diabetes. Explained that glucose may be rising during the day especially if fasting glucose is running well and A1C is 9.5%.  Encouraged patient to check glucose 2-4 times per day over the next couple of weeks and to keep a log book of glucose readings and DM medication taken which patient will need to take to follow up doctor appointments. Explained how the doctor can use the log book to continue to make adjustments with DM medications if needed.  Patient verbalized understanding of information discussed and reports no further questions at this time related to diabetes.  Thanks, Barnie Alderman, RN, MSN, CDE Diabetes Coordinator Inpatient Diabetes Program [phone] (Team Pager)

## 2019-01-21 NOTE — Progress Notes (Signed)
NP Ouma made aware of conflicting CBG checks and insulin orders. NP to discontinue duplicates.

## 2019-01-21 NOTE — Progress Notes (Signed)
S/p AM meds pts BP remains elevated/ MD aware/ adjusting meds/ will monitor.

## 2019-01-21 NOTE — Plan of Care (Signed)
  Problem: Clinical Measurements: Goal: Cardiovascular complication will be avoided Outcome: Progressing   Problem: Activity: Goal: Risk for activity intolerance will decrease Outcome: Progressing   Problem: Pain Managment: Goal: General experience of comfort will improve Outcome: Progressing   Problem: Safety: Goal: Ability to remain free from injury will improve Outcome: Progressing   

## 2019-01-22 LAB — BASIC METABOLIC PANEL
Anion gap: 10 (ref 5–15)
Anion gap: 9 (ref 5–15)
BUN: 26 mg/dL — ABNORMAL HIGH (ref 8–23)
BUN: 26 mg/dL — ABNORMAL HIGH (ref 8–23)
CO2: 20 mmol/L — ABNORMAL LOW (ref 22–32)
CO2: 19 mmol/L — ABNORMAL LOW (ref 22–32)
Calcium: 9.4 mg/dL (ref 8.9–10.3)
Calcium: 9.1 mg/dL (ref 8.9–10.3)
Chloride: 105 mmol/L (ref 98–111)
Chloride: 104 mmol/L (ref 98–111)
Creatinine, Ser: 1.67 mg/dL — ABNORMAL HIGH (ref 0.44–1.00)
Creatinine, Ser: 1.62 mg/dL — ABNORMAL HIGH (ref 0.44–1.00)
GFR calc Af Amer: 37 mL/min — ABNORMAL LOW (ref >60)
GFR calc Af Amer: 38 mL/min — ABNORMAL LOW (ref >60)
GFR calc non Af Amer: 32 mL/min — ABNORMAL LOW (ref >60)
GFR calc non Af Amer: 33 mL/min — ABNORMAL LOW (ref >60)
Glucose, Bld: 153 mg/dL — ABNORMAL HIGH (ref 70–99)
Glucose, Bld: 243 mg/dL — ABNORMAL HIGH (ref 70–99)
Potassium: 4.6 mmol/L (ref 3.5–5.1)
Potassium: 4.2 mmol/L (ref 3.5–5.1)
Sodium: 135 mmol/L (ref 135–145)
Sodium: 132 mmol/L — ABNORMAL LOW (ref 135–145)

## 2019-01-22 LAB — GLUCOSE, CAPILLARY
Glucose-Capillary: 145 mg/dL — ABNORMAL HIGH (ref 70–99)
Glucose-Capillary: 223 mg/dL — ABNORMAL HIGH (ref 70–99)
Glucose-Capillary: 143 mg/dL — ABNORMAL HIGH (ref 70–99)
Glucose-Capillary: 117 mg/dL — ABNORMAL HIGH (ref 70–99)

## 2019-01-22 LAB — CBC
HCT: 30.9 % — ABNORMAL LOW (ref 36.0–46.0)
Hemoglobin: 10.1 g/dL — ABNORMAL LOW (ref 12.0–15.0)
MCH: 27.6 pg (ref 26.0–34.0)
MCHC: 32.7 g/dL (ref 30.0–36.0)
MCV: 84.4 fL (ref 80.0–100.0)
Platelets: 256 10*3/uL (ref 150–400)
RBC: 3.66 MIL/uL — ABNORMAL LOW (ref 3.87–5.11)
RDW: 13.2 % (ref 11.5–15.5)
WBC: 8.1 10*3/uL (ref 4.0–10.5)
nRBC: 0 % (ref 0.0–0.2)

## 2019-01-22 MED ORDER — SODIUM CHLORIDE 0.9 % IV BOLUS
500.0000 mL | Freq: Once | INTRAVENOUS | Status: AC
  Administered 2019-01-22: 500 mL via INTRAVENOUS

## 2019-01-22 MED ORDER — HYDRALAZINE HCL 50 MG PO TABS: 100.0000 mg | ORAL_TABLET | Freq: Three times a day (TID) | ORAL | 2 refills | Status: DC

## 2019-01-22 MED ORDER — AMLODIPINE BESYLATE 10 MG PO TABS: 10.0000 mg | ORAL_TABLET | Freq: Every day | ORAL | 1 refills | Status: DC

## 2019-01-22 MED ORDER — HYDROXYZINE HCL 10 MG PO TABS: 10.0000 mg | ORAL_TABLET | Freq: Three times a day (TID) | ORAL | 0 refills | Status: DC | PRN

## 2019-01-22 MED ORDER — SODIUM CHLORIDE 0.9 % IV SOLN
INTRAVENOUS | Status: DC
  Administered 2019-01-22: 10:00:00 via INTRAVENOUS

## 2019-01-22 NOTE — Discharge Instructions (Signed)
You were cared for by a hospitalist during your hospital stay. If you have any questions about your discharge medications or the care you received while you were in the hospital after you are discharged, you can call the unit and ask to speak with the hospitalist on call if the hospitalist that took care of you is not available. Once you are discharged, your primary care physician will handle any further medical issues. Please note that NO REFILLS for any discharge medications will be authorized once you are discharged, as it is imperative that you return to your primary care physician (or establish a relationship with a primary care physician if you do not have one) for your aftercare needs so that they can reassess your need for medications and monitor your lab values.  Follow up with PCP within 1 week. Take all medications as prescribed. If symptoms change or worsen please return to the ED for evaluation

## 2019-01-22 NOTE — Discharge Summary (Signed)
Physician Discharge Summary  Jill Larsen EXH:[id number] DOB: [birth year] DOA: 01/20/2019  PCP: Patient, No Pcp Per  Admit date: 01/20/2019 Discharge date: 01/22/2019  Recommendations for Outpatient Follow-up:  1. Follow up with PCP within 1week 2. Please obtain BMP/CBC in one week   Home Health: none Equipment/Devices: none    Discharge Condition: stable CODE STATUS: fall Diet recommendation: heart/carb modified diet  Brief/Interim Summary (HPI)  Jill Larsen a 68 y.o.femalewith medical history significant ofhypertension, diabetes mellitus, poor vision, who presents with elevated blood pressure.  Patient states that she has been having elevated blood pressure for more than 1 week. Her PCP has adjusted her blood pressure medications recently, currently she is taking losartan 40 mg daily, Coreg 6.25 mg twice daily, clonidine 0.2 mg twice daily, spironolactone 50 mg daily. Bpis still elevated, 226/90today. Patient states that she had some numbness in the left side of the abdomen earlier, which has resolved. No unilateral weakness, numbness or tingling to extremities. Patient denies any chest pain, shortness breath, cough. No nausea vomiting, diarrhea, abdominal pain, symptoms of UTI. No facial droop or slurred speech  ED Course:pt was found to have blood pressure226/90,WBC 8.2, pending COVID-19 test, potassium 7.4 without T wave peaking, AKI with creatinine 1.67, BUN 33, temperature normal, heart rate 73, oxygen saturation 100% on room air. Patient is placed on telemetry bed for observation.  Discharge Diagnoses and Hospital Course:   Principal Problem:   Hypertensive emergency Active Problems:   AKI (acute kidney injury) (HCC)   Diabetes mellitus without complication (HCC)   Hypertension   Hyperkalemia   Hypertensive emergencyand hx of HTN: bp is 228/90 with AKI. UDS negative. Bp improved to SBP 150-160 today. No CP or SOB. Nofocal neurological findings.  -  start amlodpine 10 mg daily - coreg 6.25 mg bid - will increase hydralazine dose from 50 mg to 100 mg 3 times daily - Clonidine 0.2 mg twice daily -Hold losartan, spironolactone due to AKI  AKI (acute kidney injury) [facility]): Cre 1.67 -->1.36-->1.67-->1.62, BUN 33 -->28-->26-->26, likely due tohypertensive emergency, anddehydration and continuation of losartan and spironolactone -treated with IV fluid -Hold losartan, spironolactone -will let pt has close follow up with PCP within 1 week  Diabetes mellitus without complication (HCC):Last A1c9.5 on 01/21/19.Patient is takingLevemir and Metforminat home -continue home meds   Hyperkalemia: resolved.    Discharge Instructions   Allergies as of 01/22/2019   No Known Allergies     Medication List    STOP taking these medications   benazepril 40 MG tablet Commonly known as: LOTENSIN   spironolactone 50 MG tablet Commonly known as: ALDACTONE     TAKE these medications   amLODipine 10 MG tablet Commonly known as: NORVASC Take 1 tablet (10 mg total) by mouth daily. Start taking on: January 23, 2019   brimonidine 0.2 % ophthalmic solution Commonly known as: ALPHAGAN Place 1 drop into the right eye 2 (two) times daily.   carvedilol 6.25 MG tablet Commonly known as: COREG Take 6.25 mg by mouth 2 (two) times daily.   citalopram 20 MG tablet Commonly known as: CELEXA Take 20 mg by mouth daily.   cloNIDine 0.2 MG tablet Commonly known as: CATAPRES Take 0.2 mg by mouth 2 (two) times daily.   hydrALAZINE 50 MG tablet Commonly known as: APRESOLINE Take 2 tablets (100 mg total) by mouth every 8 (eight) hours.   hydrOXYzine 10 MG tablet Commonly known as: ATARAX/VISTARIL Take 1 tablet (10 mg total) by mouth 3 (three) times daily as  needed for anxiety.   Levemir 100 UNIT/ML injection Generic drug: insulin detemir Inject 25 Units into the skin 2 (two) times daily.   metFORMIN 1000 MG tablet Commonly known as:  GLUCOPHAGE Take 1,000 mg by mouth 2 (two) times daily.   prednisoLONE acetate 1 % ophthalmic suspension Commonly known as: PRED FORTE Place 1 drop into the right eye 3 (three) times daily.      Follow-up Information    Delsa Grana, PA-C. Go in 1 week(s).   Specialty: Family Medicine Why: [facility]. Appt is at 1:20 pm to establish new primary care. Please arrive 15 minutes prior to appt time. Contact information: [place] [address] [place] [address] [phone]          No Known Allergies  Consultations:  none   Procedures/Studies: No results found.   Subjective:  Patient feels normal, no chest pain, shortness of breath, no GI symptoms.  No unilateral weakness.  No fever or chills.  Patient strongly wants to go home today.  Discharge Exam: Vitals:   01/22/19 0716 01/22/19 0727  BP: (!) 140/53 (!) 169/57  Pulse: 88 75  Resp:  18  Temp:  98.2 F (36.8 C)  SpO2:  98%   Vitals:   01/21/19 1951 01/22/19 0352 01/22/19 0716 01/22/19 0727  BP: (!) 164/65 (!) 181/79 (!) 140/53 (!) 169/57  Pulse: 73 92 88 75  Resp: 20 20  18   Temp: 98.5 F (36.9 C) 98.3 F (36.8 C)  98.2 F (36.8 C)  TempSrc: Oral Oral    SpO2: 100% 100%  98%  Weight:  95.3 kg    Height:        General: Pt is alert, awake, not in acute distress Cardiovascular: RRR, S1/S2 +, no rubs, no gallops Respiratory: CTA bilaterally, no wheezing, no rhonchi Abdominal: Soft, NT, ND, bowel sounds + Extremities: no edema, no cyanosis    The results of significant diagnostics from this hospitalization (including imaging, microbiology, ancillary and laboratory) are listed below for reference.     Microbiology: Recent Results (from the past 240 hour(s))  SARS CORONAVIRUS 2 (TAT 6-24 HRS) Nasopharyngeal Nasopharyngeal Swab     Status: None   Collection Time: 01/20/19  4:48 PM   Specimen: Nasopharyngeal Swab  Result Value Ref Range Status   SARS Coronavirus 2 NEGATIVE  NEGATIVE Final    Comment: (NOTE) SARS-CoV-2 target nucleic acids are NOT DETECTED. The SARS-CoV-2 RNA is generally detectable in upper and lower respiratory specimens during the acute phase of infection. Negative results do not preclude SARS-CoV-2 infection, do not rule out co-infections with other pathogens, and should not be used as the sole basis for treatment or other patient management decisions. Negative results must be combined with clinical observations, patient history, and epidemiological information. The expected result is Negative. Fact Sheet for Patients: SugarRoll.be Fact Sheet for Healthcare Providers: https://www.woods-mathews.com/ This test is not yet approved or cleared by the [place] FDA and  has been authorized for detection and/or diagnosis of SARS-CoV-2 by FDA under an Emergency Use Authorization (EUA). This EUA will remain  in effect (meaning this test can be used) for the duration of the COVID-19 declaration under Section 56 4(b)(1) of the Act, 21 U.S.C. section 360bbb-3(b)(1), unless the authorization is terminated or revoked sooner. Performed at [facility], [place] [address] [place], [place] [address]      Labs: BNP (last 3 results) No results for input(s): BNP in the last 8760 hours. Basic Metabolic  Panel: Recent Labs  Lab 01/20/19 1532 01/20/19 2311 01/21/19 0523 01/22/19 0458 01/22/19 1349  NA 134* 135 136 135 132*  K 7.4* 5.5* 5.0 4.6 4.2  CL 103 106 104 105 104  CO2 20* 19* 23 20* 19*  GLUCOSE 213* 180* 191* 153* 243*  BUN 33* 33* 28* 26* 26*  CREATININE 1.67* 1.64* 1.36* 1.67* 1.62*  CALCIUM 9.6 9.0 9.5 9.4 9.1   Liver Function Tests: Recent Labs  Lab 01/20/19 1532  AST 17  ALT 8  ALKPHOS 63  BILITOT 0.7  PROT 8.1  ALBUMIN 4.2   No results for input(s): LIPASE, AMYLASE in the last 168 hours. No results for input(s): AMMONIA in the last 168 hours. CBC: Recent Labs  Lab  01/20/19 1532 01/21/19 0523 01/22/19 0458  WBC 8.2 7.3 8.1  HGB 11.1* 10.9* 10.1*  HCT 33.9* 33.0* 30.9*  MCV 84.5 83.8 84.4  PLT 294 256 256   Cardiac Enzymes: No results for input(s): CKTOTAL, CKMB, CKMBINDEX, TROPONINI in the last 168 hours. BNP: Invalid input(s): POCBNP CBG: Recent Labs  Lab 01/21/19 1217 01/21/19 1654 01/21/19 2145 01/22/19 0727 01/22/19 1212  GLUCAP 147* 159* 201* 145* 223*   D-Dimer No results for input(s): DDIMER in the last 72 hours. Hgb A1c Recent Labs    01/21/19 0526  HGBA1C 9.5*   Lipid Profile No results for input(s): CHOL, HDL, LDLCALC, TRIG, CHOLHDL, LDLDIRECT in the last 72 hours. Thyroid function studies No results for input(s): TSH, T4TOTAL, T3FREE, THYROIDAB in the last 72 hours.  Invalid input(s): FREET3 Anemia work up No results for input(s): VITAMINB12, FOLATE, FERRITIN, TIBC, IRON, RETICCTPCT in the last 72 hours. Urinalysis No results found for: COLORURINE, APPEARANCEUR, LABSPEC, PHURINE, GLUCOSEU, HGBUR, BILIRUBINUR, KETONESUR, PROTEINUR, UROBILINOGEN, NITRITE, LEUKOCYTESUR Sepsis Labs Invalid input(s): PROCALCITONIN,  WBC,  LACTICIDVEN Microbiology Recent Results (from the past 240 hour(s))  SARS CORONAVIRUS 2 (TAT 6-24 HRS) Nasopharyngeal Nasopharyngeal Swab     Status: None   Collection Time: 01/20/19  4:48 PM   Specimen: Nasopharyngeal Swab  Result Value Ref Range Status   SARS Coronavirus 2 NEGATIVE NEGATIVE Final    Comment: (NOTE) SARS-CoV-2 target nucleic acids are NOT DETECTED. The SARS-CoV-2 RNA is generally detectable in upper and lower respiratory specimens during the acute phase of infection. Negative results do not preclude SARS-CoV-2 infection, do not rule out co-infections with other pathogens, and should not be used as the sole basis for treatment or other patient management decisions. Negative results must be combined with clinical observations, patient history, and epidemiological information. The  expected result is Negative. Fact Sheet for Patients: HairSlick.nohttps://www.fda.gov/media/138098/download Fact Sheet for Healthcare Providers: quierodirigir.comhttps://www.fda.gov/media/138095/download This test is not yet approved or cleared by the [place] FDA and  has been authorized for detection and/or diagnosis of SARS-CoV-2 by FDA under an Emergency Use Authorization (EUA). This EUA will remain  in effect (meaning this test can be used) for the duration of the COVID-19 declaration under Section 56 4(b)(1) of the Act, 21 U.S.C. section 360bbb-3(b)(1), unless the authorization is terminated or revoked sooner. Performed at [facility], [address] [place], [place] [address]     Time coordinating discharge:  35 minutes  SIGNED:  Lorretta HarpXilin Abbe Bula, DO [facility] 01/22/2019, 2:29 PM Pager is on AMION  If 7PM-7AM, please contact night-coverage [email or web address] Password [email or web address]

## 2019-01-22 NOTE — Plan of Care (Signed)
  Problem: Education: Goal: Knowledge of General Education information will improve Description: Including pain rating scale, medication(s)/side effects and non-pharmacologic comfort measures Outcome: Progressing   Problem: Clinical Measurements: Goal: Cardiovascular complication will be avoided Outcome: Progressing   Problem: Activity: Goal: Risk for activity intolerance will decrease Outcome: Progressing   Problem: Pain Managment: Goal: General experience of comfort will improve Outcome: Progressing   Problem: Safety: Goal: Ability to remain free from injury will improve Outcome: Progressing   Problem: Skin Integrity: Goal: Risk for impaired skin integrity will decrease Outcome: Progressing   

## 2019-01-22 NOTE — Progress Notes (Addendum)
Pt have blood sugar blood sugar at 117 and have a scheduled levemir 18 units. Notify Prime talked to [facility] NP and states to give levemir. Will continue to monitor.  Update 2301: Pt have two active ordered of normal saline 0.9% 75 ml/hr and 136ml/hr.Notify prime and talked to [facility] and ordered to discontinue the normal saline 0.9% 75 ml/hr. Will continue to monitor.

## 2019-01-23 LAB — GLUCOSE, CAPILLARY
Glucose-Capillary: 113 mg/dL — ABNORMAL HIGH (ref 70–99)
Glucose-Capillary: 177 mg/dL — ABNORMAL HIGH (ref 70–99)

## 2019-01-23 LAB — BASIC METABOLIC PANEL
Anion gap: 10 (ref 5–15)
BUN: 21 mg/dL (ref 8–23)
CO2: 17 mmol/L — ABNORMAL LOW (ref 22–32)
Calcium: 9.1 mg/dL (ref 8.9–10.3)
Chloride: 108 mmol/L (ref 98–111)
Creatinine, Ser: 1.24 mg/dL — ABNORMAL HIGH (ref 0.44–1.00)
GFR calc Af Amer: 52 mL/min — ABNORMAL LOW (ref >60)
GFR calc non Af Amer: 45 mL/min — ABNORMAL LOW (ref >60)
Glucose, Bld: 191 mg/dL — ABNORMAL HIGH (ref 70–99)
Potassium: 4.6 mmol/L (ref 3.5–5.1)
Sodium: 135 mmol/L (ref 135–145)

## 2019-01-23 MED ORDER — SODIUM CHLORIDE 0.9 % IV SOLN
INTRAVENOUS | Status: DC
  Administered 2019-01-23 (×2): via INTRAVENOUS

## 2019-01-23 NOTE — Plan of Care (Signed)
  Problem: Education: Goal: Knowledge of General Education information will improve Description: Including pain rating scale, medication(s)/side effects and non-pharmacologic comfort measures Outcome: Progressing   Problem: Clinical Measurements: Goal: Diagnostic test results will improve Outcome: Progressing   Problem: Safety: Goal: Ability to remain free from injury will improve Outcome: Progressing   

## 2019-01-23 NOTE — Discharge Summary (Signed)
Physician Discharge Summary  Jill Larsen UXN:[id number] DOB: [birth year] DOA: 01/20/2019  PCP: Patient, No Pcp Per  Admit date: 01/20/2019 Discharge date: 01/23/2019  Recommendations for Outpatient Follow-up:  1. Follow up with PCP within 1week 2. Please obtain BMP/CBC in one week   Home Health: none Equipment/Devices: none    Discharge Condition: stable CODE STATUS: fall Diet recommendation: heart/carb modified diet  Brief/Interim Summary (HPI)  Jill Larsen a 68 y.o.femalewith medical history significant ofhypertension, diabetes mellitus, poor vision, who presents with elevated blood pressure.  Patient states that she has been having elevated blood pressure for more than 1 week. Her PCP has adjusted her blood pressure medications recently, currently she is taking losartan 40 mg daily, Coreg 6.25 mg twice daily, clonidine 0.2 mg twice daily, spironolactone 50 mg daily. Bpis still elevated, 226/90today. Patient states that she had some numbness in the left side of the abdomen earlier, which has resolved. No unilateral weakness, numbness or tingling to extremities. Patient denies any chest pain, shortness breath, cough. No nausea vomiting, diarrhea, abdominal pain, symptoms of UTI. No facial droop or slurred speech  ED Course:pt was found to have blood pressure226/90,WBC 8.2, pending COVID-19 test, potassium 7.4 without T wave peaking, AKI with creatinine 1.67, BUN 33, temperature normal, heart rate 73, oxygen saturation 100% on room air. Patient is placed on telemetry bed for observation.  Discharge Diagnoses and Hospital Course:   Principal Problem:   Hypertensive emergency Active Problems:   AKI (acute kidney injury) ([place])   Diabetes mellitus without complication ([place])   Hypertension   Hyperkalemia   Hypertensive emergencyand hx of HTN: bp is 228/90 with AKI. UDS negative. Bp improved to SBP 150-160 today. No CP or SOB. Nofocal neurological findings.  -  start amlodpine 10 mg daily - coreg 6.25 mg bid - will increase hydralazine dose from 50 mg to 100 mg 3 times daily - Clonidine 0.2 mg twice daily -Hold losartan, spironolactone due to AKI  AKI (acute kidney injury) [facility]): Cre 1.67 -->1.36-->1.67-->1.62, BUN 33 -->28-->26-->26, likely due tohypertensive emergency, anddehydration and continuation of losartan and spironolactone -treated with IV fluid -Hold losartan, spironolactone -will let pt has close follow up with PCP within 1 week  Diabetes mellitus without complication (HCC):Last U5K2.7 on 01/21/19.Patient is takingLevemir and Metforminat home -continue home meds   Hyperkalemia: resolved.    Discharge Instructions   Allergies as of 01/23/2019   No Known Allergies     Medication List    STOP taking these medications   benazepril 40 MG tablet Commonly known as: LOTENSIN   spironolactone 50 MG tablet Commonly known as: ALDACTONE     TAKE these medications   amLODipine 10 MG tablet Commonly known as: NORVASC Take 1 tablet (10 mg total) by mouth daily.   brimonidine 0.2 % ophthalmic solution Commonly known as: ALPHAGAN Place 1 drop into the right eye 2 (two) times daily.   carvedilol 6.25 MG tablet Commonly known as: COREG Take 6.25 mg by mouth 2 (two) times daily.   citalopram 20 MG tablet Commonly known as: CELEXA Take 20 mg by mouth daily.   cloNIDine 0.2 MG tablet Commonly known as: CATAPRES Take 0.2 mg by mouth 2 (two) times daily.   hydrALAZINE 50 MG tablet Commonly known as: APRESOLINE Take 2 tablets (100 mg total) by mouth every 8 (eight) hours.   hydrOXYzine 10 MG tablet Commonly known as: ATARAX/VISTARIL Take 1 tablet (10 mg total) by mouth 3 (three) times daily as needed for anxiety.   Levemir  100 UNIT/ML injection Generic drug: insulin detemir Inject 25 Units into the skin 2 (two) times daily.   metFORMIN 1000 MG tablet Commonly known as: GLUCOPHAGE Take 1,000 mg by mouth 2  (two) times daily.   prednisoLONE acetate 1 % ophthalmic suspension Commonly known as: PRED FORTE Place 1 drop into the right eye 3 (three) times daily.      Follow-up Information    Danelle Berry, PA-C. Go in 1 week(s).   Specialty: Family Medicine Why: [facility]. Appt is at 1:20 pm to establish new primary care. Please arrive 15 minutes prior to appt time. Contact information: [address] [place] [address] [phone]          No Known Allergies  Consultations:  none   Procedures/Studies: No results found.   Subjective:  Patient feels normal, no chest pain, shortness of breath, no GI symptoms.  No unilateral weakness.  No fever or chills.  Patient strongly wants to go home today.  Discharge Exam: Vitals:   01/22/19 1949 01/23/19 0503  BP: (!) 156/70 (!) 168/61  Pulse: 69 100  Resp: 19 19  Temp: 98.5 F (36.9 C) 99.1 F (37.3 C)  SpO2: 100% 100%   Vitals:   01/22/19 0727 01/22/19 1603 01/22/19 1949 01/23/19 0503  BP: (!) 169/57 (!) 164/91 (!) 156/70 (!) 168/61  Pulse: 75 70 69 100  Resp: 18 19 19 19   Temp: 98.2 F (36.8 C) 98 F (36.7 C) 98.5 F (36.9 C) 99.1 F (37.3 C)  TempSrc:   Oral Oral  SpO2: 98% 100% 100% 100%  Weight:    96 kg  Height:        General: Pt is alert, awake, not in acute distress Cardiovascular: RRR, S1/S2 +, no rubs, no gallops Respiratory: CTA bilaterally, no wheezing, no rhonchi Abdominal: Soft, NT, ND, bowel sounds + Extremities: no edema, no cyanosis    The results of significant diagnostics from this hospitalization (including imaging, microbiology, ancillary and laboratory) are listed below for reference.     Microbiology: Recent Results (from the past 240 hour(s))  SARS CORONAVIRUS 2 (TAT 6-24 HRS) Nasopharyngeal Nasopharyngeal Swab     Status: None   Collection Time: 01/20/19  4:48 PM   Specimen: Nasopharyngeal Swab  Result Value Ref Range Status   SARS Coronavirus 2  NEGATIVE NEGATIVE Final    Comment: (NOTE) SARS-CoV-2 target nucleic acids are NOT DETECTED. The SARS-CoV-2 RNA is generally detectable in upper and lower respiratory specimens during the acute phase of infection. Negative results do not preclude SARS-CoV-2 infection, do not rule out co-infections with other pathogens, and should not be used as the sole basis for treatment or other patient management decisions. Negative results must be combined with clinical observations, patient history, and epidemiological information. The expected result is Negative. Fact Sheet for Patients: 13/04/20 Fact Sheet for Healthcare Providers: HairSlick.no This test is not yet approved or cleared by the [place] FDA and  has been authorized for detection and/or diagnosis of SARS-CoV-2 by FDA under an Emergency Use Authorization (EUA). This EUA will remain  in effect (meaning this test can be used) for the duration of the COVID-19 declaration under Section 56 4(b)(1) of the Act, 21 U.S.C. section 360bbb-3(b)(1), unless the authorization is terminated or revoked sooner. Performed at [facility], [address], [place], [place]      Labs: BNP (last 3 results) No results for input(s): BNP in the last 8760 hours. Basic Metabolic  Panel: Recent Labs  Lab 01/20/19 1532 01/20/19 2311 01/21/19 0523 01/22/19 0458 01/22/19 1349  NA 134* 135 136 135 132*  K 7.4* 5.5* 5.0 4.6 4.2  CL 103 106 104 105 104  CO2 20* 19* 23 20* 19*  GLUCOSE 213* 180* 191* 153* 243*  BUN 33* 33* 28* 26* 26*  CREATININE 1.67* 1.64* 1.36* 1.67* 1.62*  CALCIUM 9.6 9.0 9.5 9.4 9.1   Liver Function Tests: Recent Labs  Lab 01/20/19 1532  AST 17  ALT 8  ALKPHOS 63  BILITOT 0.7  PROT 8.1  ALBUMIN 4.2   No results for input(s): LIPASE, AMYLASE in the last 168 hours. No results for input(s): AMMONIA in the last 168 hours. CBC: Recent Labs   Lab 01/20/19 1532 01/21/19 0523 01/22/19 0458  WBC 8.2 7.3 8.1  HGB 11.1* 10.9* 10.1*  HCT 33.9* 33.0* 30.9*  MCV 84.5 83.8 84.4  PLT 294 256 256   Cardiac Enzymes: No results for input(s): CKTOTAL, CKMB, CKMBINDEX, TROPONINI in the last 168 hours. BNP: Invalid input(s): POCBNP CBG: Recent Labs  Lab 01/22/19 0727 01/22/19 1212 01/22/19 1644 01/22/19 2151 01/23/19 0828  GLUCAP 145* 223* 143* 117* 113*   D-Dimer No results for input(s): DDIMER in the last 72 hours. Hgb A1c Recent Labs    01/21/19 0526  HGBA1C 9.5*   Lipid Profile No results for input(s): CHOL, HDL, LDLCALC, TRIG, CHOLHDL, LDLDIRECT in the last 72 hours. Thyroid function studies No results for input(s): TSH, T4TOTAL, T3FREE, THYROIDAB in the last 72 hours.  Invalid input(s): FREET3 Anemia work up No results for input(s): VITAMINB12, FOLATE, FERRITIN, TIBC, IRON, RETICCTPCT in the last 72 hours. Urinalysis No results found for: COLORURINE, APPEARANCEUR, LABSPEC, PHURINE, GLUCOSEU, HGBUR, BILIRUBINUR, KETONESUR, PROTEINUR, UROBILINOGEN, NITRITE, LEUKOCYTESUR Sepsis Labs Invalid input(s): PROCALCITONIN,  WBC,  LACTICIDVEN Microbiology Recent Results (from the past 240 hour(s))  SARS CORONAVIRUS 2 (TAT 6-24 HRS) Nasopharyngeal Nasopharyngeal Swab     Status: None   Collection Time: 01/20/19  4:48 PM   Specimen: Nasopharyngeal Swab  Result Value Ref Range Status   SARS Coronavirus 2 NEGATIVE NEGATIVE Final    Comment: (NOTE) SARS-CoV-2 target nucleic acids are NOT DETECTED. The SARS-CoV-2 RNA is generally detectable in upper and lower respiratory specimens during the acute phase of infection. Negative results do not preclude SARS-CoV-2 infection, do not rule out co-infections with other pathogens, and should not be used as the sole basis for treatment or other patient management decisions. Negative results must be combined with clinical observations, patient history, and epidemiological  information. The expected result is Negative. Fact Sheet for Patients: HairSlick.nohttps://www.fda.gov/media/138098/download Fact Sheet for Healthcare Providers: quierodirigir.comhttps://www.fda.gov/media/138095/download This test is not yet approved or cleared by the [place] FDA and  has been authorized for detection and/or diagnosis of SARS-CoV-2 by FDA under an Emergency Use Authorization (EUA). This EUA will remain  in effect (meaning this test can be used) for the duration of the COVID-19 declaration under Section 56 4(b)(1) of the Act, 21 U.S.C. section 360bbb-3(b)(1), unless the authorization is terminated or revoked sooner. Performed at [facility], [address] [place], [place] [address]     Time coordinating discharge:  35 minutes  SIGNED:  Lorretta HarpXilin Hazyl [place]se[place], DO [facility] 01/23/2019, 9:14 AM Pager is on AMION  If 7PM-7AM, please contact night-coverage [email or web address] Password [email or web address]

## 2019-01-23 NOTE — Progress Notes (Signed)
PROGRESS NOTE    Jill Larsen  UDJ:[id number] DOB: [birth year] DOA: 01/20/2019 PCP: Patient, No Pcp Per    Brief Narrative:   Jill Larsen is a 68 y.o. female with medical history significant of hypertension, diabetes mellitus, poor vision, who presents with elevated blood pressure.  Patient states that she has been having elevated blood pressure for more than 1 week.  Her PCP has adjusted her blood pressure medications recently, currently she is taking losartan 40 mg daily, Coreg 6.25 mg twice daily, clonidine 0.2 mg twice daily, spironolactone 50 mg daily.  Bp is still elevated, 226/90 today.  Patient states that she had some numbness in the left side of the abdomen earlier, which has resolved.  No unilateral weakness, numbness or tingling to extremities.  Patient denies any chest pain, shortness breath, cough.  No nausea vomiting, diarrhea, abdominal pain, symptoms of UTI. No facial droop or slurred speech  ED Course: pt was found to have blood pressure 226/90, WBC 8.2, pending COVID-19 test, potassium 7.4 without T wave peaking, AKI with creatinine 1.67, BUN 33, temperature normal, heart rate 73, oxygen saturation 100% on room air.  Patient is placed on telemetry bed for observation.   Interim History: 1.  Bp improved to 156/49 in the morning, but  later on blood pressure increased 190/73.  Patient denies chest pain or shortness of breath.  Patient has anxiety. Added oral hydralazine 50 mg 3 times daily in addition to amlodipine, Coreg, clonidine and IV as needed labetalol   Assessment & Plan:   Principal Problem:   Hypertensive emergency Active Problems:   AKI (acute kidney injury) ([place])   Diabetes mellitus without complication ([place])   Hypertension   Hyperkalemia  Hypertensive emergency and hx of HTN: bp is 228/90 with AKI. Bp improved to 156/49 in the morning, but later on her blood increase it to 1 8 increased 190/73. UDS negative.   No CP or SOB. Nofocal neurological  findings. - will change to inpt since pt cannot d/c'ed today.  - start amlodpine 10 mg daily - coreg 6.25 mg bid - will add hydralazine 50 mg 3 times daily - prn IV labetalol - Clonidine 0.2 mg twice daily  -Hold losartan, spironolactone due to AKI  AKI (acute kidney injury) [facility]): Cre 1.67 -->1.36 and BUN 33 -->28, likely due to hypertensive emergency, and dehydration and continuation of losartan and spironolactone -IV fluid: 1 L normal saline in ED, then 75 cc/h -Hold losartan, spironolactone  Diabetes mellitus without complication ([place]): Last A1c 9.5 on 01/21/19. Patient is taking Levemir and Metformin at home -will decrease Levemir dose from  25 to 18 units daily -SSI  Hyperkalemia: resolved.  K 7.4 -->5.0. Treated with D50, 10 unit of Novolog by IV, 1g of calcium chloride, 50 mEq of sodium bicarbonate plus  16.8 g of Pariromer -f/u by BMP   DVT prophylaxis: lonenox Code Status: full Family Communication: none Disposition Plan: planned to d/c home today, but her blood pressure is still running high, indicating her blood pressure is not stabilized.  We need more time to adjust her blood pressure medications before patient can be discharged.     Consultants:   none  Procedures:  none  Antimicrobials:    Subjective:  Patient feels better, no chest pain, shortness of breath, fever or chills.  No nausea vomiting, diarrhea or abdominal pain.  No unilateral weakness or numbness in the extremities.  Patient has anxiety.  Objective: Vitals:   01/22/19 0727 01/22/19 1603  01/22/19 1949 01/23/19 0503  BP: (!) 169/57 (!) 164/91 (!) 156/70 (!) 168/61  Pulse: 75 70 69 100  Resp: 18 19 19 19   Temp: 98.2 F (36.8 C) 98 F (36.7 C) 98.5 F (36.9 C) 99.1 F (37.3 C)  TempSrc:   Oral Oral  SpO2: 98% 100% 100% 100%  Weight:    96 kg  Height:        Intake/Output Summary (Last 24 hours) at 01/23/2019 0744 Last data filed at 01/23/2019 0716 Gross per 24 hour  Intake 1600  ml  Output 1350 ml  Net 250 ml   Filed Weights   01/21/19 0231 01/22/19 0352 01/23/19 0503  Weight: 96.3 kg 95.3 kg 96 kg    Examination: Physical Exam:  General: Not in acute distress HEENT: PERRL, EOMI, no scleral icterus, No JVD or bruit Cardiac: S1/S2, RRR, No murmurs, gallops or rubs Pulm:  Clear to auscultation bilaterally. No rales, wheezing, rhonchi or rubs. Abd: Soft, nondistended, nontender, no rebound pain, no organomegaly, BS present Ext: No edema. 2+DP/PT pulse bilaterally Musculoskeletal: No joint deformities, erythema, or stiffness, ROM full Skin: No rashes.  Neuro: Alert and oriented X3, cranial nerves II-XII grossly intact, muscle strength 5/5 in all extremeties, sensation to light touch intact. Psych: Patient is not psychotic, no suicidal or hemocidal ideation.    Data Reviewed: I have personally reviewed following labs and imaging studies  CBC: Recent Labs  Lab 01/20/19 1532 01/21/19 0523 01/22/19 0458  WBC 8.2 7.3 8.1  HGB 11.1* 10.9* 10.1*  HCT 33.9* 33.0* 30.9*  MCV 84.5 83.8 84.4  PLT 294 256 256   Basic Metabolic Panel: Recent Labs  Lab 01/20/19 1532 01/20/19 2311 01/21/19 0523 01/22/19 0458 01/22/19 1349  NA 134* 135 136 135 132*  K 7.4* 5.5* 5.0 4.6 4.2  CL 103 106 104 105 104  CO2 20* 19* 23 20* 19*  GLUCOSE 213* 180* 191* 153* 243*  BUN 33* 33* 28* 26* 26*  CREATININE 1.67* 1.64* 1.36* 1.67* 1.62*  CALCIUM 9.6 9.0 9.5 9.4 9.1   GFR: Estimated Creatinine Clearance: 36.9 mL/min (A) (by C-G formula based on SCr of 1.62 mg/dL (H)). Liver Function Tests: Recent Labs  Lab 01/20/19 1532  AST 17  ALT 8  ALKPHOS 63  BILITOT 0.7  PROT 8.1  ALBUMIN 4.2   No results for input(s): LIPASE, AMYLASE in the last 168 hours. No results for input(s): AMMONIA in the last 168 hours. Coagulation Profile: No results for input(s): INR, PROTIME in the last 168 hours. Cardiac Enzymes: No results for input(s): CKTOTAL, CKMB, CKMBINDEX,  TROPONINI in the last 168 hours. BNP (last 3 results) No results for input(s): PROBNP in the last 8760 hours. HbA1C: Recent Labs    01/21/19 0526  HGBA1C 9.5*   CBG: Recent Labs  Lab 01/21/19 2145 01/22/19 0727 01/22/19 1212 01/22/19 1644 01/22/19 2151  GLUCAP 201* 145* 223* 143* 117*   Lipid Profile: No results for input(s): CHOL, HDL, LDLCALC, TRIG, CHOLHDL, LDLDIRECT in the last 72 hours. Thyroid Function Tests: No results for input(s): TSH, T4TOTAL, FREET4, T3FREE, THYROIDAB in the last 72 hours. Anemia Panel: No results for input(s): VITAMINB12, FOLATE, FERRITIN, TIBC, IRON, RETICCTPCT in the last 72 hours. Sepsis Labs: No results for input(s): PROCALCITON, LATICACIDVEN in the last 168 hours.  Recent Results (from the past 240 hour(s))  SARS CORONAVIRUS 2 (TAT 6-24 HRS) Nasopharyngeal Nasopharyngeal Swab     Status: None   Collection Time: 01/20/19  4:48 PM   Specimen:  Nasopharyngeal Swab  Result Value Ref Range Status   SARS Coronavirus 2 NEGATIVE NEGATIVE Final    Comment: (NOTE) SARS-CoV-2 target nucleic acids are NOT DETECTED. The SARS-CoV-2 RNA is generally detectable in upper and lower respiratory specimens during the acute phase of infection. Negative results do not preclude SARS-CoV-2 infection, do not rule out co-infections with other pathogens, and should not be used as the sole basis for treatment or other patient management decisions. Negative results must be combined with clinical observations, patient history, and epidemiological information. The expected result is Negative. Fact Sheet for Patients: HairSlick.nohttps://www.fda.gov/media/138098/download Fact Sheet for Healthcare Providers: quierodirigir.comhttps://www.fda.gov/media/138095/download This test is not yet approved or cleared by the [place] FDA and  has been authorized for detection and/or diagnosis of SARS-CoV-2 by FDA under an Emergency Use Authorization (EUA). This EUA will remain  in effect (meaning this  test can be used) for the duration of the COVID-19 declaration under Section 56 4(b)(1) of the Act, 21 U.S.C. section 360bbb-3(b)(1), unless the authorization is terminated or revoked sooner. Performed at [facility], [address] [place], [place] [address]       Radiology Studies: No results found.      Scheduled Meds: . amLODipine  10 mg Oral Daily  . brimonidine  1 drop Right Eye BID  . carvedilol  6.25 mg Oral BID  . citalopram  20 mg Oral Daily  . cloNIDine  0.2 mg Oral BID  . enoxaparin (LOVENOX) injection  40 mg Subcutaneous Q24H  . hydrALAZINE  50 mg Oral Q8H  . insulin aspart  0-5 Units Subcutaneous QHS  . insulin aspart  0-9 Units Subcutaneous TID WC  . insulin detemir  18 Units Subcutaneous BID  . patiromer  16.8 g Oral Daily  . prednisoLONE acetate  1 drop Right Eye TID   Continuous Infusions: . sodium chloride 100 mL/hr at 01/23/19 0525     LOS: 2 days    Time spent: 30 min    Lorretta HarpXilin Makynzi [place], DO [facility] PAGER is on AMION  If 7PM-7AM, please contact night-coverage [email or web address] Password [email or web address] 01/23/2019, 7:44 AM

## 2019-02-04 ENCOUNTER — Other Ambulatory Visit: Payer: Self-pay

## 2019-02-04 ENCOUNTER — Encounter: Payer: Self-pay | Admitting: Family Medicine

## 2019-02-04 ENCOUNTER — Ambulatory Visit (INDEPENDENT_AMBULATORY_CARE_PROVIDER_SITE_OTHER): Payer: Medicare Other | Admitting: Family Medicine

## 2019-02-04 VITALS — BP 184/86 | Temp 97.9°F | Resp 14 | Ht 62.0 in | Wt 215.0 lb

## 2019-02-04 DIAGNOSIS — I161 Hypertensive emergency: Secondary | ICD-10-CM | POA: Diagnosis not present

## 2019-02-04 DIAGNOSIS — I10 Essential (primary) hypertension: Secondary | ICD-10-CM | POA: Diagnosis not present

## 2019-02-04 DIAGNOSIS — Z7689 Persons encountering health services in other specified circumstances: Secondary | ICD-10-CM

## 2019-02-04 DIAGNOSIS — E1165 Type 2 diabetes mellitus with hyperglycemia: Secondary | ICD-10-CM | POA: Diagnosis not present

## 2019-02-04 DIAGNOSIS — D649 Anemia, unspecified: Secondary | ICD-10-CM

## 2019-02-04 DIAGNOSIS — E1139 Type 2 diabetes mellitus with other diabetic ophthalmic complication: Secondary | ICD-10-CM | POA: Diagnosis not present

## 2019-02-04 DIAGNOSIS — Z09 Encounter for follow-up examination after completed treatment for conditions other than malignant neoplasm: Secondary | ICD-10-CM

## 2019-02-04 DIAGNOSIS — N179 Acute kidney failure, unspecified: Secondary | ICD-10-CM

## 2019-02-04 DIAGNOSIS — Z794 Long term (current) use of insulin: Secondary | ICD-10-CM

## 2019-02-04 DIAGNOSIS — F329 Major depressive disorder, single episode, unspecified: Secondary | ICD-10-CM

## 2019-02-04 DIAGNOSIS — E875 Hyperkalemia: Secondary | ICD-10-CM

## 2019-02-04 DIAGNOSIS — F419 Anxiety disorder, unspecified: Secondary | ICD-10-CM

## 2019-02-04 MED ORDER — HYDRALAZINE HCL 100 MG PO TABS: 100.0000 mg | ORAL_TABLET | Freq: Three times a day (TID) | ORAL | 2 refills | Status: DC

## 2019-02-04 MED ORDER — INSULIN PEN NEEDLE 32G X 4 MM MISC: 1 refills | Status: DC

## 2019-02-04 MED ORDER — METFORMIN HCL 1000 MG PO TABS: 1000.0000 mg | ORAL_TABLET | Freq: Two times a day (BID) | ORAL | 3 refills | Status: DC

## 2019-02-04 MED ORDER — BLOOD GLUCOSE MONITOR KIT: PACK | 0 refills | Status: DC

## 2019-02-04 MED ORDER — HYDROXYZINE HCL 10 MG PO TABS: 10.0000 mg | ORAL_TABLET | Freq: Three times a day (TID) | ORAL | 1 refills | Status: DC | PRN

## 2019-02-04 MED ORDER — CITALOPRAM HYDROBROMIDE 20 MG PO TABS: 20.0000 mg | ORAL_TABLET | Freq: Every day | ORAL | 3 refills | Status: DC

## 2019-02-04 MED ORDER — INSULIN DETEMIR 100 UNIT/ML FLEXPEN: 10.0000 [IU] | PEN_INJECTOR | Freq: Two times a day (BID) | SUBCUTANEOUS | 6 refills | Status: DC

## 2019-02-04 NOTE — Progress Notes (Signed)
Name: Jill Larsen   MRN: [id number]    DOB: Oct 09, 1950   Date:02/04/2019       Progress Note  Chief Complaint  Patient presents with  . Establish Care     Subjective:   Jill Larsen is a 68 y.o. female, presents to clinic to establish care and for follow-up after recent hospitalization for hypertensive emergency and AKI  Patient is a 68 year old African-American female, she recently moved down from [place] to be with her family here, initially it was in Feb 2020 for eye surgery in [place], she goes to [facility] for Ophthalmology, however while recovering from eye surgery COVID 19 pandemic happened and she decided to reocated here permanently with family in [place] with daughter, spouse and grandchildren. She did have a doctor in [place] and her medications have been refilled but she has not been seen for any virtual visits, in person visits or had any labs for most of this year.  Patient notes a long history of hypertension,  HTN:  For the last several years BP has been difficult to control with her PCP SBP 200's  Here for hospital follow up/transition of care.  Admit date: 01/20/2019 Discharge date: 01/23/2019 Transition of care was initiated previously by [place] Scrape on 01/21/2019 and med changes, diagnosis, specialist follow ups and pts symptoms and condition were all reviewed, no other TOC visit or phone call was arranged due to close PCP f/up arranged within a week of discharge  Pt was admitted for hypertensive emergency, aki, DM w/o complication, HTN, and hyperkalemia Prior to her hospitalization patient's blood pressure was managed on benazepril 40 mg, spironolactone, coreg, clonidine New medications started per hospitalization include hydralazine and Norvasc and benazepril and spironolactone were discontinued. Patient is here with her daughter-in-law and they did bring in the packet from hospital discharge.  In reviewing their medications and the discharge summary she has  not been taking the prescribed dose of hydralazine has been taking 50 mg 2-3 times a day we did review that 100 mg 3 times a day was what was prescribed.    Review of plan at time of discharge: Hypertensive emergencyand hx of HTN: bp is 228/90 with AKI.UDSnegative. Bpimproved to SBP 150-160 today. No CPor SOB. Nofocal neurological findings.  - start amlodpine 10 mg daily - coreg 6.25 mg bid - will increasehydralazine dose from 50 mg to 100 mg 3 times daily - Clonidine 0.2 mg twice daily -Hold losartan, spironolactone due to AKI  AKI (acute kidney injury) [facility]): Cre 1.67-->1.36-->1.67-->1.62, BUN 33 -->28-->26-->26, likely due tohypertensive emergency, anddehydration and continuation of losartan and spironolactone -treated with IV fluid -Hold losartan, spironolactone -will let pt has close follow up with PCP within 1 week  Diabetes mellitus without complication (HCC):Last L8V5.6 on 01/21/19.Patient is takingLevemir and Metforminat home -continue home meds   Hyperkalemia:resolved.  Of note the patient's discharge was delayed several days because of poorly controlled blood pressure.  An EKG was done in the ER troponin was done when she went into the ER urine drug screen was negative.  I did not see cardiology consult or an echo or renal artery duplex study.  There is no records available from her past PCP although there notes that there has been many medication changes and attempts to control her blood pressure.  Patient's blood pressure is elevated again today.  She is slightly nervous, and states she is asymptomatic denies headaches, chest pain, palpitations, near syncope, numbness weakness tingling visual disturbances.   No labs were  due, but with recent AKI, hyperkalemia, continued HTN urgency and poorly controlled DM will recheck CMP. Pt feels overall good but she is anxious to get established and improve her blood pressure control and her blood sugar control.  BP  Readings from Last 3 Encounters:  02/04/19 (!) 184/86  01/23/19 (!) 169/86     Diabetes Mellitus Type II: Insulin-dependent poorly controlled Metformin 1000 mg BID, Levemir 25 Units BID CBGs not checking, patient and her family member state that the blood sugar meter is not working well it is relatively new glucometer, they did not bring it with him today. No hypoglycemic episodes - not documented, but she felt like is sometimes low Denies: Polyuria, polydipsia, polyphagia, vision changes, or neuropathy Recent pertinent labs: Lab Results  Component Value Date   HGBA1C 9.5 (H) 01/21/2019    Current diet: in general, a "healthy" diet   NOT UTD on DM foot exam - see ophtho  Hypertension:  Having diffiuclty with BP management as noted above No lightheadedness, hypotension, syncope. Blood pressure today is elevated, even with repeat BP readings manually on both arms BP Readings from Last 3 Encounters:  02/04/19 (!) 184/86  01/23/19 (!) 169/86   Pt denies CP, SOB, exertional sx, LE edema, palpitation, Ha's, visual disturbances Dietary efforts for BP?  Low salt     Patient Active Problem List   Diagnosis Date Noted  . AKI (acute kidney injury) ([place]) 01/20/2019  . Diabetes mellitus without complication ([place])   . Hypertension   . Hypertensive emergency   . Hyperkalemia     Past Surgical History:  Procedure Laterality Date  . EYE SURGERY      Family History  Problem Relation Age of Onset  . Stroke Mother   . Diabetes type II Mother   . Stroke Father   . Diabetes Mellitus II Brother   . Cancer Sister   . Diabetes Son   . Diabetes Mellitus II Brother   . Kidney disease Brother   . Prostate cancer Brother     Social History   Socioeconomic History  . Marital status: Widowed    Spouse name: Not on file  . Number of children: 3  . Years of education: 72  . Highest education level: Some college, no degree  Occupational History  . Not on file  Social Needs  .  Financial resource strain: Not hard at all  . Food insecurity    Worry: Never true    Inability: Never true  . Transportation needs    Medical: No    Non-medical: No  Tobacco Use  . Smoking status: Never Smoker  . Smokeless tobacco: Never Used  Substance and Sexual Activity  . Alcohol use: Never    Frequency: Never  . Drug use: Never  . Sexual activity: Not Currently  Lifestyle  . Physical activity    Days per week: 0 days    Minutes per session: 0 min  . Stress: Not at all  Relationships  . Social connections    Talks on phone: More than three times a week    Gets together: More than three times a week    Attends religious service: Never    Active member of club or organization: No    Attends meetings of clubs or organizations: Never    Relationship status: Widowed  . Intimate partner violence    Fear of current or ex partner: Not on file    Emotionally abused: Not on file  Physically abused: Not on file    Forced sexual activity: Not on file  Other Topics Concern  . Not on file  Social History Narrative  . Not on file     Current Outpatient Medications:  .  amLODipine (NORVASC) 10 MG tablet, Take 1 tablet (10 mg total) by mouth daily., Disp: 90 tablet, Rfl: 1 .  brimonidine (ALPHAGAN) 0.2 % ophthalmic solution, Place 1 drop into the right eye 2 (two) times daily., Disp: , Rfl:  .  carvedilol (COREG) 6.25 MG tablet, Take 6.25 mg by mouth 2 (two) times daily., Disp: , Rfl:  .  citalopram (CELEXA) 20 MG tablet, Take 20 mg by mouth daily., Disp: , Rfl:  .  cloNIDine (CATAPRES) 0.2 MG tablet, Take 0.2 mg by mouth 2 (two) times daily., Disp: , Rfl:  .  hydrALAZINE (APRESOLINE) 50 MG tablet, Take 2 tablets (100 mg total) by mouth every 8 (eight) hours., Disp: 120 tablet, Rfl: 2 .  hydrOXYzine (ATARAX/VISTARIL) 10 MG tablet, Take 1 tablet (10 mg total) by mouth 3 (three) times daily as needed for anxiety., Disp: 15 tablet, Rfl: 0 .  insulin detemir (LEVEMIR) 100 UNIT/ML  injection, Inject 25 Units into the skin 2 (two) times daily., Disp: , Rfl:  .  metFORMIN (GLUCOPHAGE) 1000 MG tablet, Take 1,000 mg by mouth 2 (two) times daily., Disp: , Rfl:  .  prednisoLONE acetate (PRED FORTE) 1 % ophthalmic suspension, Place 1 drop into the right eye 3 (three) times daily., Disp: , Rfl:   No Known Allergies  I personally reviewed active problem list, medication list, allergies, family history, social history, health maintenance, notes from last encounter, lab results, imaging with the patient/caregiver today.   Review of Systems  Constitutional: Negative.   HENT: Negative.   Eyes: Negative.   Respiratory: Negative.   Cardiovascular: Negative.   Gastrointestinal: Negative.   Endocrine: Negative.   Genitourinary: Negative.   Musculoskeletal: Negative.   Skin: Negative.   Allergic/Immunologic: Negative.   Neurological: Negative.   Hematological: Negative.   Psychiatric/Behavioral: Negative.   All other systems reviewed and are negative.    Objective:    Vitals:   02/04/19 1330  BP: (!) 184/86  Resp: 14  Temp: 97.9 F (36.6 C)  Weight: 215 lb (97.5 kg)  Height: '5\' 2"'$  (1.575 m)    Body mass index is 39.32 kg/m.  Physical Exam Vitals signs and nursing note reviewed.  Constitutional:      General: She is not in acute distress.    Appearance: Normal appearance. She is well-developed. She is not ill-appearing, toxic-appearing or diaphoretic.     Interventions: Face mask in place.  HENT:     Head: Normocephalic and atraumatic.     Right Ear: External ear normal.     Left Ear: External ear normal.     Nose: Nose normal.     Mouth/Throat:     Mouth: Mucous membranes are moist.     Pharynx: Oropharynx is clear.  Eyes:     General: Lids are normal. No scleral icterus.       Right eye: No discharge.        Left eye: No discharge.     Conjunctiva/sclera: Conjunctivae normal.  Neck:     Musculoskeletal: Normal range of motion and neck supple.      Trachea: Phonation normal. No tracheal deviation.  Cardiovascular:     Rate and Rhythm: Normal rate and regular rhythm.  No extrasystoles are present.  Chest Wall: PMI is not displaced.     Pulses: Normal pulses.          Radial pulses are 2+ on the right side and 2+ on the left side.       Posterior tibial pulses are 2+ on the right side and 2+ on the left side.     Heart sounds: Normal heart sounds. No murmur. No friction rub. No gallop.   Pulmonary:     Effort: Pulmonary effort is normal. No respiratory distress.     Breath sounds: Normal breath sounds. No stridor. No wheezing, rhonchi or rales.  Chest:     Chest wall: No tenderness.  Abdominal:     General: Bowel sounds are normal. There is no distension.     Palpations: Abdomen is soft.     Tenderness: There is no abdominal tenderness. There is no guarding or rebound.  Musculoskeletal: Normal range of motion.        General: No deformity.     Right lower leg: No edema.     Left lower leg: No edema.  Lymphadenopathy:     Cervical: No cervical adenopathy.  Skin:    General: Skin is warm and dry.     Capillary Refill: Capillary refill takes less than 2 seconds.     Coloration: Skin is not jaundiced or pale.     Findings: No rash.  Neurological:     General: No focal deficit present.     Mental Status: She is alert and oriented to person, place, and time.     Cranial Nerves: No cranial nerve deficit, dysarthria or facial asymmetry.     Sensory: Sensation is intact.     Motor: Motor function is intact. No weakness, tremor or abnormal muscle tone.     Coordination: Coordination is intact.     Gait: Gait is intact. Gait normal.  Psychiatric:        Mood and Affect: Mood normal.        Speech: Speech normal.        Behavior: Behavior normal.      Recent Results (from the past 2160 hour(s))  CBC     Status: Abnormal   Collection Time: 01/20/19  3:32 PM  Result Value Ref Range   WBC 8.2 4.0 - 10.5 K/uL   RBC 4.01 3.87 -  5.11 MIL/uL   Hemoglobin 11.1 (L) 12.0 - 15.0 g/dL   HCT 33.9 (L) 36.0 - 46.0 %   MCV 84.5 80.0 - 100.0 fL   MCH 27.7 26.0 - 34.0 pg   MCHC 32.7 30.0 - 36.0 g/dL   RDW 13.3 11.5 - 15.5 %   Platelets 294 150 - 400 K/uL   nRBC 0.0 0.0 - 0.2 %    Comment: Performed at [facility], [address] [place], [place] [address]  CMP     Status: Abnormal   Collection Time: 01/20/19  3:32 PM  Result Value Ref Range   Sodium 134 (L) 135 - 145 mmol/L   Potassium 7.4 (HH) 3.5 - 5.1 mmol/L    Comment: CRITICAL RESULT CALLED TO, READ BACK BY AND VERIFIED WITH ALLY YOW AT 1627 01/20/2019  TFK    Chloride 103 98 - 111 mmol/L   CO2 20 (L) 22 - 32 mmol/L   Glucose, Bld 213 (H) 70 - 99 mg/dL   BUN 33 (H) 8 - 23 mg/dL   Creatinine, Ser 1.67 (H) 0.44 - 1.00 mg/dL   Calcium 9.6 8.9 -  10.3 mg/dL   Total Protein 8.1 6.5 - 8.1 g/dL   Albumin 4.2 3.5 - 5.0 g/dL   AST 17 15 - 41 U/L   ALT 8 0 - 44 U/L   Alkaline Phosphatase 63 38 - 126 U/L   Total Bilirubin 0.7 0.3 - 1.2 mg/dL   GFR calc non Af Amer 32 (L) >60 mL/min   GFR calc Af Amer 37 (L) >60 mL/min   Anion gap 11 5 - 15    Comment: Performed at [facility], [place], [place] [address]  Troponin I (High Sensitivity)     Status: None   Collection Time: 01/20/19  3:32 PM  Result Value Ref Range   Troponin I (High Sensitivity) 7 <18 ng/L    Comment: (NOTE) Elevated high sensitivity troponin I (hsTnI) values and significant  changes across serial measurements may suggest ACS but many other  chronic and acute conditions are known to elevate hsTnI results.  Refer to the "Links" section for chest pain algorithms and additional  guidance. Performed at [facility], [place], [place] [address]   SARS CORONAVIRUS 2 (TAT 6-24 HRS) Nasopharyngeal Nasopharyngeal Swab     Status: None   Collection Time: 01/20/19  4:48 PM   Specimen: Nasopharyngeal Swab  Result Value Ref Range   SARS  Coronavirus 2 NEGATIVE NEGATIVE    Comment: (NOTE) SARS-CoV-2 target nucleic acids are NOT DETECTED. The SARS-CoV-2 RNA is generally detectable in upper and lower respiratory specimens during the acute phase of infection. Negative results do not preclude SARS-CoV-2 infection, do not rule out co-infections with other pathogens, and should not be used as the sole basis for treatment or other patient management decisions. Negative results must be combined with clinical observations, patient history, and epidemiological information. The expected result is Negative. Fact Sheet for Patients: SugarRoll.be Fact Sheet for Healthcare Providers: https://www.woods-mathews.com/ This test is not yet approved or cleared by the [place] FDA and  has been authorized for detection and/or diagnosis of SARS-CoV-2 by FDA under an Emergency Use Authorization (EUA). This EUA will remain  in effect (meaning this test can be used) for the duration of the COVID-19 declaration under Section 56 4(b)(1) of the Act, 21 U.S.C. section 360bbb-3(b)(1), unless the authorization is terminated or revoked sooner. Performed at [facility], [place] [address] [place], [place] [address]   Basic metabolic panel     Status: Abnormal   Collection Time: 01/20/19 11:11 PM  Result Value Ref Range   Sodium 135 135 - 145 mmol/L   Potassium 5.5 (H) 3.5 - 5.1 mmol/L   Chloride 106 98 - 111 mmol/L   CO2 19 (L) 22 - 32 mmol/L   Glucose, Bld 180 (H) 70 - 99 mg/dL   BUN 33 (H) 8 - 23 mg/dL   Creatinine, Ser 1.64 (H) 0.44 - 1.00 mg/dL   Calcium 9.0 8.9 - 10.3 mg/dL   GFR calc non Af Amer 32 (L) >60 mL/min   GFR calc Af Amer 37 (L) >60 mL/min   Anion gap 10 5 - 15    Comment: Performed at [facility], [address] [place], [place] [address]  Glucose, capillary     Status: Abnormal   Collection Time: 01/21/19  2:31 AM  Result Value Ref Range   Glucose-Capillary 154  (H) 70 - 99 mg/dL  AM - BMET     Status: Abnormal   Collection Time: 01/21/19  5:23 AM  Result Value Ref Range   Sodium 136 135 - 145 mmol/L   Potassium 5.0 3.5 - 5.1 mmol/L   Chloride 104 98 - 111 mmol/L   CO2 23 22 - 32 mmol/L   Glucose, Bld 191 (H) 70 - 99 mg/dL   BUN 28 (H) 8 - 23 mg/dL   Creatinine, Ser 1.36 (H) 0.44 - 1.00 mg/dL   Calcium 9.5 8.9 - 10.3 mg/dL   GFR calc non Af Amer 40 (L) >60 mL/min   GFR calc Af Amer 47 (L) >60 mL/min   Anion gap 9 5 - 15    Comment: Performed at [facility], [address] [place], [place] [address]  HIV Antibody (routine testing w rflx)     Status: None   Collection Time: 01/21/19  5:23 AM  Result Value Ref Range   HIV Screen 4th Generation wRfx NON REACTIVE NON REACTIVE    Comment: Performed at [place] [address] [place], [place] [address]  CBC     Status: Abnormal   Collection Time: 01/21/19  5:23 AM  Result Value Ref Range   WBC 7.3 4.0 - 10.5 K/uL   RBC 3.94 3.87 - 5.11 MIL/uL   Hemoglobin 10.9 (L) 12.0 - 15.0 g/dL   HCT 33.0 (L) 36.0 - 46.0 %   MCV 83.8 80.0 - 100.0 fL   MCH 27.7 26.0 - 34.0 pg   MCHC 33.0 30.0 - 36.0 g/dL   RDW 13.3 11.5 - 15.5 %   Platelets 256 150 - 400 K/uL   nRBC 0.0 0.0 - 0.2 %    Comment: Performed at [facility], [address] [place], [place] [address]  Hemoglobin A1c     Status: Abnormal   Collection Time: 01/21/19  5:26 AM  Result Value Ref Range   Hgb A1c MFr Bld 9.5 (H) 4.8 - 5.6 %    Comment: (NOTE) Pre diabetes:          5.7%-6.4% Diabetes:              >6.4% Glycemic control for   <7.0% adults with diabetes    Mean Plasma Glucose 225.95 mg/dL    Comment: Performed at [facility], [place] [address], [place], [place] [address]  Glucose, capillary     Status: Abnormal   Collection Time: 01/21/19  5:40 AM  Result Value Ref Range   Glucose-Capillary 159 (H) 70 - 99 mg/dL  Glucose, capillary     Status: Abnormal   Collection Time: 01/21/19  7:59  AM  Result Value Ref Range   Glucose-Capillary 171 (H) 70 - 99 mg/dL  Urine Drug Screen, Qualitative ([facility] only)     Status: None   Collection Time: 01/21/19  8:50 AM  Result Value Ref Range   Tricyclic, Ur Screen NONE DETECTED NONE DETECTED   Amphetamines, Ur Screen NONE DETECTED NONE DETECTED   MDMA (Ecstasy)Ur Screen NONE DETECTED NONE DETECTED   Cocaine Metabolite,Ur [place] NONE DETECTED NONE DETECTED   Opiate, Ur Screen NONE DETECTED NONE DETECTED   Phencyclidine (PCP) Ur S NONE DETECTED NONE DETECTED   Cannabinoid 50 Ng, Ur  NONE DETECTED NONE DETECTED   Barbiturates, Ur Screen NONE DETECTED NONE DETECTED   Benzodiazepine, Ur Scrn NONE DETECTED NONE DETECTED   Methadone Scn, Ur NONE DETECTED NONE DETECTED    Comment: (NOTE) Tricyclics + metabolites, urine    Cutoff 1000 ng/mL Amphetamines + metabolites, urine  Cutoff 1000 ng/mL MDMA (Ecstasy), urine  Cutoff 500 ng/mL Cocaine Metabolite, urine          Cutoff 300 ng/mL Opiate + metabolites, urine        Cutoff 300 ng/mL Phencyclidine (PCP), urine         Cutoff 25 ng/mL Cannabinoid, urine                 Cutoff 50 ng/mL Barbiturates + metabolites, urine  Cutoff 200 ng/mL Benzodiazepine, urine              Cutoff 200 ng/mL Methadone, urine                   Cutoff 300 ng/mL The urine drug screen provides only a preliminary, unconfirmed analytical test result and should not be used for non-medical purposes. Clinical consideration and professional judgment should be applied to any positive drug screen result due to possible interfering substances. A more specific alternate chemical method must be used in order to obtain a confirmed analytical result. Gas chromatography / mass spectrometry (GC/MS) is the preferred confirmat ory method. Performed at [facility], [address] [place], [place] [address]   Glucose, capillary     Status: Abnormal   Collection Time: 01/21/19 12:17 PM  Result Value Ref Range    Glucose-Capillary 147 (H) 70 - 99 mg/dL  Glucose, capillary     Status: Abnormal   Collection Time: 01/21/19  4:54 PM  Result Value Ref Range   Glucose-Capillary 159 (H) 70 - 99 mg/dL  Glucose, capillary     Status: Abnormal   Collection Time: 01/21/19  9:45 PM  Result Value Ref Range   Glucose-Capillary 201 (H) 70 - 99 mg/dL  AM - BMET     Status: Abnormal   Collection Time: 01/22/19  4:58 AM  Result Value Ref Range   Sodium 135 135 - 145 mmol/L   Potassium 4.6 3.5 - 5.1 mmol/L   Chloride 105 98 - 111 mmol/L   CO2 20 (L) 22 - 32 mmol/L   Glucose, Bld 153 (H) 70 - 99 mg/dL   BUN 26 (H) 8 - 23 mg/dL   Creatinine, Ser 1.67 (H) 0.44 - 1.00 mg/dL   Calcium 9.4 8.9 - 10.3 mg/dL   GFR calc non Af Amer 32 (L) >60 mL/min   GFR calc Af Amer 37 (L) >60 mL/min   Anion gap 10 5 - 15    Comment: Performed at [facility], [address] [place], [place] [address]  AM - CBC     Status: Abnormal   Collection Time: 01/22/19  4:58 AM  Result Value Ref Range   WBC 8.1 4.0 - 10.5 K/uL   RBC 3.66 (L) 3.87 - 5.11 MIL/uL   Hemoglobin 10.1 (L) 12.0 - 15.0 g/dL   HCT 30.9 (L) 36.0 - 46.0 %   MCV 84.4 80.0 - 100.0 fL   MCH 27.6 26.0 - 34.0 pg   MCHC 32.7 30.0 - 36.0 g/dL   RDW 13.2 11.5 - 15.5 %   Platelets 256 150 - 400 K/uL   nRBC 0.0 0.0 - 0.2 %    Comment: Performed at [facility], [address] [place], [place] [address]  Glucose, capillary     Status: Abnormal   Collection Time: 01/22/19  7:27 AM  Result Value Ref Range   Glucose-Capillary 145 (H) 70 - 99 mg/dL  Glucose, capillary     Status: Abnormal   Collection Time: 01/22/19 12:12 PM  Result Value  Ref Range   Glucose-Capillary 223 (H) 70 - 99 mg/dL  AM - BMET     Status: Abnormal   Collection Time: 01/22/19  1:49 PM  Result Value Ref Range   Sodium 132 (L) 135 - 145 mmol/L   Potassium 4.2 3.5 - 5.1 mmol/L   Chloride 104 98 - 111 mmol/L   CO2 19 (L) 22 - 32 mmol/L   Glucose, Bld 243 (H) 70 - 99 mg/dL    BUN 26 (H) 8 - 23 mg/dL   Creatinine, Ser 1.62 (H) 0.44 - 1.00 mg/dL   Calcium 9.1 8.9 - 10.3 mg/dL   GFR calc non Af Amer 33 (L) >60 mL/min   GFR calc Af Amer 38 (L) >60 mL/min   Anion gap 9 5 - 15    Comment: Performed at [facility], [address] [place], [place] [address]  Glucose, capillary     Status: Abnormal   Collection Time: 01/22/19  4:44 PM  Result Value Ref Range   Glucose-Capillary 143 (H) 70 - 99 mg/dL  Glucose, capillary     Status: Abnormal   Collection Time: 01/22/19  9:51 PM  Result Value Ref Range   Glucose-Capillary 117 (H) 70 - 99 mg/dL   Comment 1 Notify RN    Comment 2 Document in Chart   Glucose, capillary     Status: Abnormal   Collection Time: 01/23/19  8:28 AM  Result Value Ref Range   Glucose-Capillary 113 (H) 70 - 99 mg/dL   Comment 1 Notify RN    Comment 2 Document in Chart   Glucose, capillary     Status: Abnormal   Collection Time: 01/23/19 11:31 AM  Result Value Ref Range   Glucose-Capillary 177 (H) 70 - 99 mg/dL   Comment 1 Notify RN    Comment 2 Document in Chart   AM - BMET     Status: Abnormal   Collection Time: 01/23/19 11:33 AM  Result Value Ref Range   Sodium 135 135 - 145 mmol/L   Potassium 4.6 3.5 - 5.1 mmol/L   Chloride 108 98 - 111 mmol/L   CO2 17 (L) 22 - 32 mmol/L   Glucose, Bld 191 (H) 70 - 99 mg/dL   BUN 21 8 - 23 mg/dL   Creatinine, Ser 1.24 (H) 0.44 - 1.00 mg/dL   Calcium 9.1 8.9 - 10.3 mg/dL   GFR calc non Af Amer 45 (L) >60 mL/min   GFR calc Af Amer 52 (L) >60 mL/min   Anion gap 10 5 - 15    Comment: Performed at [facility], [address] [place], [place] [address]     PHQ2/9: Depression screen [facility] 2/9 02/04/2019  Decreased Interest 0  Down, Depressed, Hopeless 0  PHQ - 2 Score 0  Altered sleeping 0  Tired, decreased energy 0  Change in appetite 0  Feeling bad or failure about yourself  0  Trouble concentrating 0  Moving slowly or fidgety/restless 0  Suicidal thoughts 0  PHQ-9  Score 0  Difficult doing work/chores Not difficult at all    phq 9 is negative reviewed  Fall Risk: Fall Risk  02/04/2019  Falls in the past year? 1  Number falls in past yr: 1  Injury with Fall? 0      Functional Status Survey: Is the patient deaf or have difficulty hearing?: No Does the patient have difficulty seeing, even when wearing glasses/contacts?: Yes Does the patient have difficulty concentrating, remembering, or  making decisions?: No Does the patient have difficulty walking or climbing stairs?: Yes Does the patient have difficulty dressing or bathing?: No Does the patient have difficulty doing errands alone such as visiting a doctor's office or shopping?: Yes    Assessment & Plan:       ICD-10-CM   1. Hypertensive emergency  I16.1 hydrALAZINE (APRESOLINE) 100 MG tablet   currently htn urgency asymptomatic, recheck renal function and potassium, start hydralazine 100 mg TID and monitor BP f/up closely    2. Essential hypertension  I10 hydrALAZINE (APRESOLINE) 100 MG tablet   need records from last PCP to see past HTN med hx and any past work up?       3. Type 2 diabetes mellitus with other ophthalmic complication, with long-term current use of insulin (HCC)  E11.39 Insulin Detemir (LEVEMIR) 100 UNIT/ML Pen   Z79.4 blood glucose meter kit and supplies KIT    Insulin Pen Needle 32G X 4 MM MISC    metFORMIN (GLUCOPHAGE) 1000 MG tablet   Discussed at length with the patient that she needs to check her blood sugars with insulin management of diabetes new meter rx given To not currently suspect endorgan damage no change in urine output no chest pain, shortness of breath, headaches, neuro deficit, signs of CHF     4. Uncontrolled type 2 diabetes mellitus with hyperglycemia (HCC)  E11.65 Insulin Detemir (LEVEMIR) 100 UNIT/ML Pen    blood glucose meter kit and supplies KIT    Insulin Pen Needle 32G X 4 MM MISC    metFORMIN (GLUCOPHAGE) 1000 MG tablet   see above      5. Hyperkalemia  E87.5 CMP w GFR   Recheck labs, potassium sparing meds were discontinued in the hospital but patient may need ACE inhibitor back on     6. Anemia, unspecified type  D64.9 CMP w GFR    CBC w/ Diff   Anemia noted from hospital labs recheck    7. AKI (acute kidney injury) ([place])  N17.9 CMP w GFR   Recheck renal function    8. Major depressive disorder with current active episode, unspecified depression episode severity, unspecified whether recurrent  F32.9 citalopram (CELEXA) 20 MG tablet   Well-controlled with citalopram    9. Anxiety disorder, unspecified type  F41.9 hydrOXYzine (ATARAX/VISTARIL) 10 MG tablet    citalopram (CELEXA) 20 MG tablet   Still very symptomatic when it comes to her health, on citalopram and has Vistaril as needed    10. Encounter to establish care with new doctor  Z76.89    Need records from past PCP    11. Encounter for examination following treatment at hospital  Z09    Extensive chart review with recent hospitalization    Specific instructions were written down reviewed with the patient and with her daughter-in-law with medication changes, how to monitor blood pressure at home, how to monitor blood sugar at home, ER precautions and concerning signs and symptoms which they need to follow-up immediately   Return in about 2 weeks (around 02/18/2019) for BP recheck .   Delsa Grana, PA-C 02/04/19 2:00 PM

## 2019-02-04 NOTE — Patient Instructions (Signed)
Take hydralazine 100 mg three times a day  WE will call you with your lab results  Please go to [facility] and see if they will give you another blood sugar meter.   How to Take Your Blood Pressure You can take your blood pressure at home with a machine. You may need to check your blood pressure at home:  To check if you have high blood pressure (hypertension).  To check your blood pressure over time.  To make sure your blood pressure medicine is working. Supplies needed: You will need a blood pressure machine, or monitor. You can buy one at a drugstore or online. When choosing one:  Choose one with an arm cuff.  Choose one that wraps around your upper arm. Only one finger should fit between your arm and the cuff.  Do not choose one that measures your blood pressure from your wrist or finger. Your doctor can suggest a monitor. How to prepare Avoid these things for 30 minutes before checking your blood pressure:  Drinking caffeine.  Drinking alcohol.  Eating.  Smoking.  Exercising. Five minutes before checking your blood pressure:  Pee.  Sit in a dining chair. Avoid sitting in a soft couch or armchair.  Be quiet. Do not talk. How to take your blood pressure Follow the instructions that came with your machine. If you have a digital blood pressure monitor, these may be the instructions: 1. Sit up straight. 2. Place your feet on the floor. Do not cross your ankles or legs. 3. Rest your left arm at the level of your heart. You may rest it on a table, desk, or chair. 4. Pull up your shirt sleeve. 5. Wrap the blood pressure cuff around the upper part of your left arm. The cuff should be 1 inch (2.5 cm) above your elbow. It is best to wrap the cuff around bare skin. 6. Fit the cuff snugly around your arm. You should be able to place only one finger between the cuff and your arm. 7. Put the cord inside the groove of your elbow. 8. Press the power button. 9. Sit quietly while  the cuff fills with air and loses air. 10. Write down the numbers on the screen. 11. Wait 2-3 minutes and then repeat steps 1-10. What do the numbers mean? Two numbers make up your blood pressure. The first number is called systolic pressure. The second is called diastolic pressure. An example of a blood pressure reading is "120 over 80" (or 120/80). If you are an adult and do not have a medical condition, use this guide to find out if your blood pressure is normal: Normal  First number: below 120.  Second number: below 80. Elevated  First number: 120-129.  Second number: below 80. Hypertension stage 1  First number: 130-139.  Second number: 80-89. Hypertension stage 2  First number: 140 or above.  Second number: 90 or above. Your blood pressure is above normal even if only the top or bottom number is above normal. Follow these instructions at home:  Check your blood pressure as often as your doctor tells you to.  Take your monitor to your next doctor's appointment. Your doctor will: ? Make sure you are using it correctly. ? Make sure it is working right.  Make sure you understand what your blood pressure numbers should be.  Tell your doctor if your medicines are causing side effects. Contact a doctor if:  Your blood pressure keeps being high. Get help right away  if:  Your first blood pressure number is higher than 180.  Your second blood pressure number is higher than 120. This information is not intended to replace advice given to you by your health care provider. Make sure you discuss any questions you have with your health care provider. Document Released: 02/15/2008 Document Revised: 02/14/2017 Document Reviewed: 08/11/2015 Elsevier Patient Education  2020 [employer].

## 2019-02-05 LAB — CBC WITH DIFFERENTIAL/PLATELET
Absolute Monocytes: 787 cells/uL (ref 200–950)
Basophils Absolute: 37 cells/uL (ref 0–200)
Basophils Relative: 0.6 %
Eosinophils Absolute: 372 cells/uL (ref 15–500)
Eosinophils Relative: 6 %
HCT: 32.5 % — ABNORMAL LOW (ref 35.0–45.0)
Hemoglobin: 10.4 g/dL — ABNORMAL LOW (ref 11.7–15.5)
Lymphs Abs: 1612 cells/uL (ref 850–3900)
MCH: 27.3 pg (ref 27.0–33.0)
MCHC: 32 g/dL (ref 32.0–36.0)
MCV: 85.3 fL (ref 80.0–100.0)
MPV: 11 fL (ref 7.5–12.5)
Monocytes Relative: 12.7 %
Neutro Abs: 3391 cells/uL (ref 1500–7800)
Neutrophils Relative %: 54.7 %
Platelets: 319 10*3/uL (ref 140–400)
RBC: 3.81 10*6/uL (ref 3.80–5.10)
RDW: 13.8 % (ref 11.0–15.0)
Total Lymphocyte: 26 %
WBC: 6.2 10*3/uL (ref 3.8–10.8)

## 2019-02-05 LAB — COMPLETE METABOLIC PANEL WITH GFR
AG Ratio: 1.6 (calc) (ref 1.0–2.5)
ALT: 13 U/L (ref 6–29)
AST: 14 U/L (ref 10–35)
Albumin: 4.2 g/dL (ref 3.6–5.1)
Alkaline phosphatase (APISO): 65 U/L (ref 37–153)
BUN/Creatinine Ratio: 13 (calc) (ref 6–22)
BUN: 18 mg/dL (ref 7–25)
CO2: 21 mmol/L (ref 20–32)
Calcium: 9.8 mg/dL (ref 8.6–10.4)
Chloride: 103 mmol/L (ref 98–110)
Creat: 1.41 mg/dL — ABNORMAL HIGH (ref 0.50–0.99)
GFR, Est African American: 45 mL/min/{1.73_m2} — ABNORMAL LOW (ref >=60)
GFR, Est Non African American: 39 mL/min/{1.73_m2} — ABNORMAL LOW (ref >=60)
Globulin: 2.7 g/dL (calc) (ref 1.9–3.7)
Glucose, Bld: 187 mg/dL — ABNORMAL HIGH (ref 65–99)
Potassium: 5.4 mmol/L — ABNORMAL HIGH (ref 3.5–5.3)
Sodium: 134 mmol/L — ABNORMAL LOW (ref 135–146)
Total Bilirubin: 0.2 mg/dL (ref 0.2–1.2)
Total Protein: 6.9 g/dL (ref 6.1–8.1)

## 2019-02-08 ENCOUNTER — Telehealth: Payer: Self-pay

## 2019-02-08 DIAGNOSIS — N179 Acute kidney failure, unspecified: Secondary | ICD-10-CM

## 2019-02-08 DIAGNOSIS — E875 Hyperkalemia: Secondary | ICD-10-CM

## 2019-02-08 NOTE — Telephone Encounter (Signed)
-----   Message from Delsa Grana, [place] sent at 02/08/2019 12:55 PM EST ----- Patient's anemia is stable, a little higher than 2 weeks ago when she was in the hospital  Her kidney function is still decreased similar to when she was in the hospital, is chronic kidney disease stage III -and her potassium is mildly elevated, I want her to recheck these to make sure that potassium is not going higher where I need to give her medicine to bring it down.  Please have her come in for labs this week or she can go to the hospital for labs just to recheck hyperkalemia and AKI.  Please have her avoid foods that are high in potassium, make sure she has no supplements or prescription medications that she is taking with potassium in it.    Can you ask her how her blood pressure is running at home?  After increasing hydralazine dose? Did she get her glucometer from [place]?

## 2019-02-09 ENCOUNTER — Encounter: Payer: Self-pay | Admitting: Family Medicine

## 2019-02-09 ENCOUNTER — Other Ambulatory Visit: Payer: Self-pay | Admitting: Family Medicine

## 2019-02-09 DIAGNOSIS — I10 Essential (primary) hypertension: Secondary | ICD-10-CM

## 2019-02-09 LAB — BASIC METABOLIC PANEL WITH GFR
BUN/Creatinine Ratio: 19 (calc) (ref 6–22)
BUN: 24 mg/dL (ref 7–25)
CO2: 22 mmol/L (ref 20–32)
Calcium: 9.4 mg/dL (ref 8.6–10.4)
Chloride: 104 mmol/L (ref 98–110)
Creat: 1.28 mg/dL — ABNORMAL HIGH (ref 0.50–0.99)
GFR, Est African American: 50 mL/min/{1.73_m2} — ABNORMAL LOW (ref >=60)
GFR, Est Non African American: 44 mL/min/{1.73_m2} — ABNORMAL LOW (ref >=60)
Glucose, Bld: 120 mg/dL — ABNORMAL HIGH (ref 65–99)
Potassium: 5.3 mmol/L (ref 3.5–5.3)
Sodium: 135 mmol/L (ref 135–146)

## 2019-02-09 MED ORDER — LISINOPRIL-HYDROCHLOROTHIAZIDE 20-12.5 MG PO TABS: 1.0000 | ORAL_TABLET | Freq: Every day | ORAL | 1 refills | Status: DC

## 2019-02-18 ENCOUNTER — Ambulatory Visit (INDEPENDENT_AMBULATORY_CARE_PROVIDER_SITE_OTHER): Payer: Medicare Other | Admitting: Family Medicine

## 2019-02-18 ENCOUNTER — Other Ambulatory Visit: Payer: Self-pay

## 2019-02-18 ENCOUNTER — Encounter: Payer: Self-pay | Admitting: Family Medicine

## 2019-02-18 VITALS — BP 196/84 | HR 82 | Ht 62.0 in | Wt 214.0 lb

## 2019-02-18 DIAGNOSIS — E1165 Type 2 diabetes mellitus with hyperglycemia: Secondary | ICD-10-CM | POA: Diagnosis not present

## 2019-02-18 DIAGNOSIS — I10 Essential (primary) hypertension: Secondary | ICD-10-CM | POA: Diagnosis not present

## 2019-02-18 NOTE — Progress Notes (Signed)
Name: Jill Larsen   MRN: [id number]    DOB: Sep 01, 1950   Date:02/18/2019       Progress Note  Subjective:    Chief Complaint  Chief Complaint  Patient presents with  . Follow-up  . Hypertension    bp still running high.  Pt states has been taking the hydralazine tid, except for today because it make her heart race and she feel funny.    I connected with  [name]  on 02/18/19 at  2:40 PM EST by a video enabled telemedicine application and verified that I am speaking with the correct person using two identifiers.  I discussed the limitations of evaluation and management by telemedicine and the availability of in person appointments. The patient expressed understanding and agreed to proceed. Staff also discussed with the patient that there may be a patient responsible charge related to this service. Patient Location: home Provider Location: [facility] clinic Additional Individuals present: DIL  HPI   Pt presents for blood pressure recheck. Lab OV was 02/04/2019 and blood pressure was elevated to 184/86, after hospitalization for HTN urgency, pt was new to establish care, past med hx was not readily available.  Todays HPI is somewhat limited by pt's primary care given being absent.    They do not have BP readings but they have been writing them down.     BP Readings from Last 5 Encounters:  02/18/19 (!) 196/84  02/04/19 (!) 184/86  01/23/19 (!) 169/86    Meds were changed to clarify dose of hydralazine from 50 mg TID to 100 mg TID  Taking meds as directed, but did not take today, she denies any concerns or SE to me, but previously noted some "funny" feelings she reports shes been compliant with med changes. Pt denies chest pain, SOB, dizziness, or heart palpitations.  Denies medication side effects.  BP still not controlled  She still has not been able to get her glucometer to work.  She states they got the new one with Rx given to them at last appt, but still doesn't seem to  work, no CBG readings.   Patient Active Problem List   Diagnosis Date Noted  . AKI (acute kidney injury) ([place]) 01/20/2019  . Diabetes mellitus without complication ([place])   . Hypertension   . Hypertensive emergency   . Hyperkalemia   . Diabetic macular edema ([place]) 05/21/2018  . 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 Vitreous hemorrhage of right eye ([place]) 02/14/2017    Social History   Tobacco Use  . Smoking status: Never Smoker  . Smokeless tobacco: Never Used  Substance Use Topics  . Alcohol use: Never    Frequency: Never     Current Outpatient Medications:  .  amLODipine (NORVASC) 10 MG tablet, Take 1 tablet (10 mg total) by mouth daily., Disp: 90 tablet, Rfl: 1 .  brimonidine (ALPHAGAN) 0.2 % ophthalmic solution, Place 1 drop into the right eye 2 (two) times daily., Disp: , Rfl:  .  carvedilol (COREG) 6.25 MG tablet, Take 6.25 mg by mouth 2 (two) times daily., Disp: , Rfl:  .  citalopram (CELEXA) 20 MG tablet, Take 1 tablet (20 mg total) by mouth daily., Disp: 90 tablet, Rfl: 3 .  cloNIDine (CATAPRES) 0.2 MG tablet, Take 0.2 mg by mouth 2 (two) times daily., Disp: , Rfl:  .  hydrOXYzine (ATARAX/VISTARIL) 10 MG  tablet, Take 1 tablet (10 mg total) by mouth 3 (three) times daily as needed for anxiety., Disp: 90 tablet, Rfl: 1 .  insulin detemir (LEVEMIR) 100 UNIT/ML injection, Inject 25 Units into the skin 2 (two) times daily., Disp: , Rfl:  .  lisinopril-hydrochlorothiazide (ZESTORETIC) 20-12.5 MG tablet, Take 1 tablet by mouth daily., Disp: 30 tablet, Rfl: 1 .  metFORMIN (GLUCOPHAGE) 1000 MG tablet, Take 1 tablet (1,000 mg total) by mouth 2 (two) times daily., Disp: 180 tablet, Rfl: 3 .  prednisoLONE acetate (PRED FORTE) 1 % ophthalmic suspension, Place 1 drop into the right eye 3 (three) times daily., Disp: , Rfl:  .  blood glucose meter  kit and supplies KIT, Dispense based on patient and insurance preference. Use up to four times daily as directed. (FOR ICD-9 250.00, 250.01)., Disp: 1 each, Rfl: 0 .  hydrALAZINE (APRESOLINE) 100 MG tablet, Take 1 tablet (100 mg total) by mouth 3 (three) times daily. (Patient not taking: Reported on 02/18/2019), Disp: 90 tablet, Rfl: 2 .  Insulin Detemir (LEVEMIR) 100 UNIT/ML Pen, Inject 10-30 Units into the skin 2 (two) times daily. (Patient not taking: Reported on 02/18/2019), Disp: 15 mL, Rfl: 6 .  Insulin Pen Needle 32G X 4 MM MISC, Use as directed with insulin daily, Disp: 100 each, Rfl: 1  No Known Allergies  I personally reviewed active problem list, medication list, health maintenance, notes from last encounter, lab results, imaging with the patient/caregiver today.  ROS Review of Systems  Constitutional: Negative.  HENT: Negative.  Eyes: Negative.  Respiratory: Negative.  Cardiovascular: Negative.  Gastrointestinal: Negative.  Endocrine: Negative.  Genitourinary: Negative.  Musculoskeletal: Negative.  Skin: Negative.  Allergic/Immunologic: Negative.  Neurological: Negative.  Hematological: Negative.  Psychiatric/Behavioral: Negative.  All other systems reviewed and are negative.   Objective:   Virtual encounter, vitals limited, only able to obtain the following Today's Vitals   02/18/19 1448  BP: (!) 196/84  Pulse: 82  Weight: 214 lb (97.1 kg)  Height: '5\' 2"'$  (1.575 m)   Body mass index is 39.14 kg/m. Nursing Note and Vital Signs reviewed.  Physical Exam Physical Exam  Vitals signs and nursing note reviewed.  Constitutional:  General: She is not in acute distress. Appearance: Normal appearance. She is well-developed. She is not ill-appearing, toxic-appearing or diaphoretic.  HENT:  Head: Normocephalic and atraumatic.  Nose: Nose normal.  Eyes:  General:  Right eye: No discharge.  Left eye: No discharge.  Conjunctiva/sclera: Conjunctivae normal.  Neck:   Trachea: No tracheal deviation.  Pulmonary:  Effort: Pulmonary effort is normal. No respiratory distress.  Breath sounds: No stridor.  Skin:  Findings: No rash.  Neurological:  Mental Status: She is alert.  Motor: No abnormal muscle tone.  Coordination: Coordination normal.  Psychiatric:  Behavior: Behavior normal.   PE limited by telephone encounter  No results found for this or any previous visit (from the past 72 hour(s)).  Assessment and Plan:      1. Hypertension, unspecified type  I10    poorly controlled HTN, seems she is compliant with meds, no signs or sx concerning for end organ damage.  They will send in more BP readings when pt's daughter gets home.    2. Uncontrolled type 2 diabetes mellitus with hyperglycemia (HCC)  E11.65    no readings, discussed dangers of treating DM with insulin w/o monitoring blood sugar - an additional glucometer rx was given They were asked to take glucometer back to pharmacy for help or here to  clinic so we may be able to help.  The pt and her daughter in law say they will get it to work or buy another one, asked to bring it with them to the next visit in person or send CBG readings in through [place].     -Red flags and when to present for emergency care or RTC including chest pain, shortness of breath, new/worsening/un-resolving symptoms,  reviewed with patient at time of visit. Follow up and care instructions discussed and provided in AVS. - I discussed the assessment and treatment plan with the patient. The patient was provided an opportunity to ask questions and all were answered. The patient agreed with the plan and demonstrated an understanding of the instructions.  Did confirm email for mychart and they state they will set it up and send in BP and CBG readings  -Red flags and when to present for emergency care or RTC including fever >101.36F, chest pain, shortness of breath, new/worsening/un-resolving symptoms,  reviewed with  patient at time of visit. Follow up and care instructions discussed and provided in AVS. - I discussed the assessment and treatment plan with the patient. The patient was provided an opportunity to ask questions and all were answered. The patient agreed with the plan and demonstrated an understanding of the instructions.  I provided 18 minutes of non-face-to-face time during this encounter.  Delsa Grana, PA-C 02/18/19 3:13 PM

## 2019-02-18 NOTE — Patient Instructions (Addendum)
Blood pressure goals for Ms. Jill Larsen would be 140/90  Please send in your BP readings so we can better adjust your medications to high readings throughout the day.

## 2019-02-22 ENCOUNTER — Telehealth: Payer: Self-pay

## 2019-02-22 DIAGNOSIS — E1165 Type 2 diabetes mellitus with hyperglycemia: Secondary | ICD-10-CM

## 2019-02-22 DIAGNOSIS — Z794 Long term (current) use of insulin: Secondary | ICD-10-CM

## 2019-02-22 MED ORDER — BLOOD GLUCOSE MONITOR KIT: PACK | 0 refills | Status: DC

## 2019-02-22 NOTE — Telephone Encounter (Signed)
Copied from [place] [id number]. Topic: General - Inquiry >> Feb 22, 2019 12:07 PM Jill Larsen, NT wrote: Reason for CRM: Pt's daughter called in stating pt would like to have a new script for a glucose monitor due to it not working. Please advise.

## 2019-02-26 ENCOUNTER — Other Ambulatory Visit: Payer: Self-pay

## 2019-02-26 DIAGNOSIS — E1139 Type 2 diabetes mellitus with other diabetic ophthalmic complication: Secondary | ICD-10-CM

## 2019-02-26 DIAGNOSIS — E1165 Type 2 diabetes mellitus with hyperglycemia: Secondary | ICD-10-CM

## 2019-02-26 MED ORDER — BLOOD GLUCOSE MONITOR KIT: PACK | 0 refills | Status: DC

## 2019-03-05 ENCOUNTER — Other Ambulatory Visit: Payer: Self-pay

## 2019-03-05 DIAGNOSIS — E1165 Type 2 diabetes mellitus with hyperglycemia: Secondary | ICD-10-CM

## 2019-03-05 DIAGNOSIS — E1139 Type 2 diabetes mellitus with other diabetic ophthalmic complication: Secondary | ICD-10-CM

## 2019-03-05 MED ORDER — BLOOD GLUCOSE MONITOR KIT: PACK | 0 refills | Status: DC

## 2019-03-05 NOTE — Telephone Encounter (Signed)
Daughter notified, refaxed.  I f does not work this time she will pick up rx.  Will file up front

## 2019-03-05 NOTE — Telephone Encounter (Signed)
Pt's daughter calling to f/up on this request.  Pt's daughter would like to confirm that fax was sent to pharmacy. Please call to confirm.

## 2019-03-11 ENCOUNTER — Other Ambulatory Visit: Payer: Self-pay | Admitting: Emergency Medicine

## 2019-03-11 DIAGNOSIS — E1139 Type 2 diabetes mellitus with other diabetic ophthalmic complication: Secondary | ICD-10-CM

## 2019-03-11 DIAGNOSIS — E1165 Type 2 diabetes mellitus with hyperglycemia: Secondary | ICD-10-CM

## 2019-03-11 MED ORDER — BLOOD GLUCOSE MONITOR KIT: PACK | 0 refills | Status: DC

## 2019-03-17 ENCOUNTER — Ambulatory Visit (INDEPENDENT_AMBULATORY_CARE_PROVIDER_SITE_OTHER): Payer: Medicare Other | Admitting: Family Medicine

## 2019-03-17 ENCOUNTER — Encounter: Payer: Self-pay | Admitting: Family Medicine

## 2019-03-17 ENCOUNTER — Other Ambulatory Visit: Payer: Self-pay

## 2019-03-17 VITALS — BP 148/92 | HR 98 | Temp 97.9°F | Resp 14 | Ht 62.0 in | Wt 216.3 lb

## 2019-03-17 DIAGNOSIS — E1139 Type 2 diabetes mellitus with other diabetic ophthalmic complication: Secondary | ICD-10-CM

## 2019-03-17 DIAGNOSIS — Z794 Long term (current) use of insulin: Secondary | ICD-10-CM | POA: Diagnosis not present

## 2019-03-17 DIAGNOSIS — E1165 Type 2 diabetes mellitus with hyperglycemia: Secondary | ICD-10-CM | POA: Diagnosis not present

## 2019-03-17 DIAGNOSIS — E113511 Type 2 diabetes mellitus with proliferative diabetic retinopathy with macular edema, right eye: Secondary | ICD-10-CM

## 2019-03-17 DIAGNOSIS — I10 Essential (primary) hypertension: Secondary | ICD-10-CM

## 2019-03-17 DIAGNOSIS — L97519 Non-pressure chronic ulcer of other part of right foot with unspecified severity: Secondary | ICD-10-CM | POA: Diagnosis not present

## 2019-03-17 LAB — GLUCOSE, POCT (MANUAL RESULT ENTRY): POC Glucose: 141 mg/dl — AB (ref 70–99)

## 2019-03-17 MED ORDER — BLOOD GLUCOSE MONITOR KIT: PACK | 0 refills | Status: DC

## 2019-03-17 MED ORDER — SULFAMETHOXAZOLE-TRIMETHOPRIM 800-160 MG PO TABS: 1.0000 | ORAL_TABLET | Freq: Two times a day (BID) | ORAL | 0 refills | Status: AC

## 2019-03-17 NOTE — Progress Notes (Signed)
Name: Jill Larsen   MRN: [id number]    DOB: 1950-11-27   Date:03/17/2019       Progress Note  Chief Complaint  Patient presents with  . Hypertension    I got good with my 1st reading, then saw her's and rechecked and it was higher?  . Diabetes    still uanble to get glucometer  . Foot Ulcer    bottom right foot     Subjective:   Jill Larsen is a 68 y.o. female, presents to clinic for routine follow up on the conditions listed above.  Uncontrolled HTN Hypertension:  BP has improved from past readings with current management with hydralazine 100 mg 3 times daily, Coreg 6.25 twice daily, amlodipine 10 mg, lisinopril HCTZ 20-12.5 Pt reports good med compliance and denies any SE.  They are monitoring BP at home but it makes patient very anxious, her daughter and DIL and taking it w/o showing her the readings, it runs 140-160 SBP, improved since last visit No lightheadedness, hypotension, syncope. Blood pressure today is improving, would be very happy with SBP 140-150, and do suspect BP easily spikes with anxiety and some white coat htn BP Readings from Last 3 Encounters:  03/17/19 (!) 148/92  02/18/19 (!) 196/84  02/04/19 (!) 184/86   Pt denies CP, SOB, exertional sx, LE edema, palpitation, Ha's, visual disturbances   Uncontrolled DM - has had difficulty getting glucometer - have prescribed or printed more than 4x since she established care here about 6 weeks ago.  They told us they still have not been able to get a meter we have called the pharmacy and pharmacy says they do not even have the prescription another 1 will be provided today Diabetes Mellitus Type II: Fasting today, not checking CBG at home, last A1c was 1 month ago so will not repeat today but will get a point-of-care glucose Currently managing with patient is still giving herself her Levemir insulin and taking Metformin 1000 mg twice daily and she endorses suspected hypoglycemic episodes which she treats with peanut  butter.   Denies: Polyuria, polydipsia, polyphagia, vision changes, or neuropathy She does have a blister on her foot which she only recently noticed, she denies any foot pain and numbness redness or swelling  Recent pertinent labs: Lab Results  Component Value Date   HGBA1C 9.5 (H) 01/21/2019      Component Value Date/Time   NA 135 02/09/2019 0000   K 5.3 02/09/2019 0000   CL 104 02/09/2019 0000   CO2 22 02/09/2019 0000   GLUCOSE 120 (H) 02/09/2019 0000   BUN 24 02/09/2019 0000   CREATININE 1.28 (H) 02/09/2019 0000   CALCIUM 9.4 02/09/2019 0000   PROT 6.9 02/04/2019 0000   ALBUMIN 4.2 01/20/2019 1532   AST 14 02/04/2019 0000   ALT 13 02/04/2019 0000   ALKPHOS 63 01/20/2019 1532   BILITOT 0.2 02/04/2019 0000   GFRNONAA 44 (L) 02/09/2019 0000   GFRAA 50 (L) 02/09/2019 0000   Point-of-care CBG today 141    Patient Active Problem List   Diagnosis Date Noted  . AKI (acute kidney injury) ([place]) 01/20/2019  . Diabetes mellitus without complication ([place])   . Hypertension   . Hypertensive emergency   . Hyperkalemia   . Diabetic macular edema ([place]) 05/21/2018  . 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 Vitreous hemorrhage of right eye (  [place]) 02/14/2017    Past Surgical History:  Procedure Laterality Date  . EYE SURGERY      Family History  Problem Relation Age of Onset  . Stroke Mother   . Diabetes type II Mother   . Stroke Father   . Diabetes Mellitus II Brother   . Cancer Sister   . Diabetes Son   . Diabetes Mellitus II Brother   . Kidney disease Brother   . Prostate cancer Brother     Social History   Socioeconomic History  . Marital status: Widowed    Spouse name: Not on file  . Number of children: 3  . Years of education: 17  . Highest education level: Some college, no degree  Occupational History  . Not on file    Tobacco Use  . Smoking status: Never Smoker  . Smokeless tobacco: Never Used  Substance and Sexual Activity  . Alcohol use: Never  . Drug use: Never  . Sexual activity: Not Currently  Other Topics Concern  . Not on file  Social History Narrative  . Not on file   Social Determinants of Health   Financial Resource Strain: Low Risk   . Difficulty of Paying Living Expenses: Not hard at all  Food Insecurity: No Food Insecurity  . Worried About Charity fundraiser in the Last Year: Never true  . Ran Out of Food in the Last Year: Never true  Transportation Needs: No Transportation Needs  . Lack of Transportation (Medical): No  . Lack of Transportation (Non-Medical): No  Physical Activity: Inactive  . Days of Exercise per Week: 0 days  . Minutes of Exercise per Session: 0 min  Stress: No Stress Concern Present  . Feeling of Stress : Not at all  Social Connections: Moderately Isolated  . Frequency of Communication with Friends and Family: More than three times a week  . Frequency of Social Gatherings with Friends and Family: More than three times a week  . Attends Religious Services: Never  . Active Member of Clubs or Organizations: No  . Attends Archivist Meetings: Never  . Marital Status: Widowed  Intimate Partner Violence:   . Fear of Current or Ex-Partner: Not on file  . Emotionally Abused: Not on file  . Physically Abused: Not on file  . Sexually Abused: Not on file     Current Outpatient Medications:  .  amLODipine (NORVASC) 10 MG tablet, Take 1 tablet (10 mg total) by mouth daily., Disp: 90 tablet, Rfl: 1 .  blood glucose meter kit and supplies KIT, Dispense based on patient and insurance preference. Use up to four times daily for blood sugar monitoring. (FOR Sx IDDM ICD-10-E11.65)., Disp: 1 each, Rfl: 0 .  brimonidine (ALPHAGAN) 0.2 % ophthalmic solution, Place 1 drop into the right eye 2 (two) times daily., Disp: , Rfl:  .  carvedilol (COREG) 6.25 MG tablet,  Take 6.25 mg by mouth 2 (two) times daily., Disp: , Rfl:  .  citalopram (CELEXA) 20 MG tablet, Take 1 tablet (20 mg total) by mouth daily., Disp: 90 tablet, Rfl: 3 .  cloNIDine (CATAPRES) 0.2 MG tablet, Take 0.2 mg by mouth 2 (two) times daily., Disp: , Rfl:  .  hydrALAZINE (APRESOLINE) 100 MG tablet, Take 1 tablet (100 mg total) by mouth 3 (three) times daily., Disp: 90 tablet, Rfl: 2 .  hydrOXYzine (ATARAX/VISTARIL) 10 MG tablet, Take 1 tablet (10 mg total) by mouth 3 (three) times daily as needed for anxiety., Disp:  90 tablet, Rfl: 1 .  insulin detemir (LEVEMIR) 100 UNIT/ML injection, Inject 25 Units into the skin 2 (two) times daily., Disp: , Rfl:  .  Insulin Detemir (LEVEMIR) 100 UNIT/ML Pen, Inject 10-30 Units into the skin 2 (two) times daily., Disp: 15 mL, Rfl: 6 .  Insulin Pen Needle 32G X 4 MM MISC, Use as directed with insulin daily, Disp: 100 each, Rfl: 1 .  lisinopril-hydrochlorothiazide (ZESTORETIC) 20-12.5 MG tablet, Take 1 tablet by mouth daily., Disp: 30 tablet, Rfl: 1 .  metFORMIN (GLUCOPHAGE) 1000 MG tablet, Take 1 tablet (1,000 mg total) by mouth 2 (two) times daily., Disp: 180 tablet, Rfl: 3 .  prednisoLONE acetate (PRED FORTE) 1 % ophthalmic suspension, Place 1 drop into the right eye 3 (three) times daily., Disp: , Rfl:  .  sulfamethoxazole-trimethoprim (BACTRIM DS) 800-160 MG tablet, Take 1 tablet by mouth 2 (two) times daily for 7 days., Disp: 14 tablet, Rfl: 0  No Known Allergies  I personally reviewed active problem list, medication list, allergies, family history, social history, health maintenance, notes from last encounter, lab results, imaging with the patient/caregiver today.   Review of Systems  Constitutional: Negative.   HENT: Negative.   Eyes: Negative.   Respiratory: Negative.   Cardiovascular: Negative.   Gastrointestinal: Negative.   Endocrine: Negative.   Genitourinary: Negative.   Musculoskeletal: Negative.   Skin: Negative.     Allergic/Immunologic: Negative.   Neurological: Negative.   Hematological: Negative.   Psychiatric/Behavioral: Negative.   All other systems reviewed and are negative.    Objective:    Vitals:   03/17/19 1054 03/17/19 1057  BP: 134/82 (!) 148/92  Pulse: 98   Resp: 14   Temp: 97.9 F (36.6 C)   SpO2: 99%   Weight: 216 lb 4.8 oz (98.1 kg)   Height: '5\' 2"'$  (1.575 m)     Body mass index is 39.56 kg/m.  Physical Exam Vitals and nursing note reviewed.  Constitutional:      General: She is not in acute distress.    Appearance: Normal appearance. She is well-developed. She is obese. She is not ill-appearing, toxic-appearing or diaphoretic.     Interventions: Face mask in place.  HENT:     Head: Normocephalic and atraumatic.     Right Ear: External ear normal.     Left Ear: External ear normal.  Eyes:     General: Lids are normal. No scleral icterus.       Right eye: No discharge.        Left eye: No discharge.     Conjunctiva/sclera: Conjunctivae normal.  Neck:     Trachea: Phonation normal. No tracheal deviation.  Cardiovascular:     Rate and Rhythm: Normal rate and regular rhythm.     Pulses: Normal pulses.          Radial pulses are 2+ on the right side and 2+ on the left side.       Posterior tibial pulses are 2+ on the right side and 2+ on the left side.     Heart sounds: Normal heart sounds. No murmur. No friction rub. No gallop.   Pulmonary:     Effort: Pulmonary effort is normal. No respiratory distress.     Breath sounds: Normal breath sounds. No stridor. No wheezing, rhonchi or rales.  Chest:     Chest wall: No tenderness.  Abdominal:     General: Bowel sounds are normal. There is no distension.     Palpations:  Abdomen is soft.     Tenderness: There is no abdominal tenderness. There is no guarding or rebound.  Musculoskeletal:        General: No deformity. Normal range of motion.     Cervical back: Normal range of motion and neck supple.     Right lower  leg: No edema.     Left lower leg: No edema.  Feet:     Right foot:     Skin integrity: Blister present. No skin breakdown.     Toenail Condition: Right toenails are normal.     Left foot:     Skin integrity: Skin integrity normal. No ulcer.     Toenail Condition: Left toenails are normal.     Comments: Right plantar 1st MTP area roughly 3 cm diameter blister with mild surrounding edema to great to, 1st MTP joint area, no surrounding induration, no warmth, no streaking erythema No ttp, skin intact, appears to have clear fluid underneath Lymphadenopathy:     Cervical: No cervical adenopathy.  Skin:    General: Skin is warm and dry.     Capillary Refill: Capillary refill takes less than 2 seconds.     Coloration: Skin is not jaundiced or pale.     Findings: No rash.  Neurological:     Mental Status: She is alert and oriented to person, place, and time.     Motor: No abnormal muscle tone.     Gait: Gait normal.  Psychiatric:        Speech: Speech normal.        Behavior: Behavior normal.        Diabetic Foot Exam: Diabetic Foot Exam - Simple   Simple Foot Form Diabetic Foot exam was performed with the following findings: Yes 03/17/2019 10:50 AM  Visual Inspection Sensation Testing Pulse Check Comments      PHQ2/9: Depression screen [facility] 2/9 03/17/2019 02/18/2019 02/04/2019  Decreased Interest 0 0 0  Down, Depressed, Hopeless 0 0 0  PHQ - 2 Score 0 0 0  Altered sleeping 0 0 0  Tired, decreased energy 0 0 0  Change in appetite 0 0 0  Feeling bad or failure about yourself  0 0 0  Trouble concentrating 0 0 0  Moving slowly or fidgety/restless 0 0 0  Suicidal thoughts 0 0 0  PHQ-9 Score 0 0 0  Difficult doing work/chores Not difficult at all Not difficult at all Not difficult at all    phq 9 is negative reviewed  Fall Risk: Fall Risk  03/17/2019 02/18/2019 02/04/2019  Falls in the past year? 1 0 1  Number falls in past yr: 1 0 1  Injury with Fall? 0 0 0       Functional Status Survey: Is the patient deaf or have difficulty hearing?: No Does the patient have difficulty seeing, even when wearing glasses/contacts?: Yes Does the patient have difficulty concentrating, remembering, or making decisions?: No Does the patient have difficulty walking or climbing stairs?: Yes Does the patient have difficulty dressing or bathing?: No Does the patient have difficulty doing errands alone such as visiting a doctor's office or shopping?: Yes    Assessment & Plan:   1. Ulcer of right foot, unspecified ulcer stage ([place]) Start abx, urgent referral to podiatry, discussed concerning signs and symptoms which she should go to the ER for evaluation with her uncontrolled diabetes is high risk for spreading infection. - Ambulatory referral to Podiatry  2. Type 2 diabetes mellitus with other  ophthalmic complication, with long-term current use of insulin ([place]) Still not monitoring her blood sugars but still also administering long-acting insulin to herself I have written a prescription for the glucometer multiple times we have called the pharmacy today I rewrote the prescription she was instructed as well as her daughter to get a glucometer even if they have to buy it themselves so that they can safely monitor her blood sugars Handouts were given regarding signs and symptoms of hypoglycemia, given diabetes handout information regarding target fasting blood sugar ranges, her goal is to have fasting CBG be 80-150, titrate Levemir dose if blood sugars in the morning when fasting are greater than 150 by increasing her morning dose by 2 units and waiting 3 to 7 days to see the response also encouraged her to follow-up in 1 month with blood sugar readings or sooner as needed for assistance with dose adjustments - blood glucose meter kit and supplies KIT; Dispense based on patient and insurance preference. Use up to four times daily for blood sugar monitoring. (FOR Sx IDDM  ICD-10-E11.65).  Dispense: 1 each; Refill: 0 - POCT Glucose (CBG)  3. Uncontrolled type 2 diabetes mellitus with hyperglycemia ([place]) See above - blood glucose meter kit and supplies KIT; Dispense based on patient and insurance preference. Use up to four times daily for blood sugar monitoring. (FOR Sx IDDM ICD-10-E11.65).  Dispense: 1 each; Refill: 0 - Ambulatory referral to Podiatry - POCT Glucose (CBG)  4. 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 Hypertension, unspecified type BP readings improved, con't same meds and doses Because patient is very anxious and this seems to dramatically affect her blood pressure patient was instructed to only check occasionally or if she is feeling bad do not want her to be constantly checking and anxious about her readings.  She today she even stated that she will not eat breakfast because she is worried that the food will spike her blood pressure.  F/up for next routine OV, sooner if BP elevated over 160 on average   Return for Feb 5th or shortly after than for f/up appt DM/HTN/HLD.   Delsa Grana, PA-C 03/17/19 9:19 PM

## 2019-03-17 NOTE — Patient Instructions (Signed)
Send in some blood sugar readings when you get your meter  Blood sugar in the morning before you eat - our goal would be to have you 80-150 fasting  See diabetes info hand out for other info and number goals   Let me know if blood sugars are >150 and we will need to adjust your meds

## 2019-03-23 ENCOUNTER — Ambulatory Visit: Payer: Medicare Other | Admitting: Podiatry

## 2019-04-09 ENCOUNTER — Other Ambulatory Visit: Payer: Self-pay

## 2019-04-09 ENCOUNTER — Ambulatory Visit (INDEPENDENT_AMBULATORY_CARE_PROVIDER_SITE_OTHER): Payer: Medicare Other

## 2019-04-09 DIAGNOSIS — Z780 Asymptomatic menopausal state: Secondary | ICD-10-CM | POA: Diagnosis not present

## 2019-04-09 DIAGNOSIS — Z1231 Encounter for screening mammogram for malignant neoplasm of breast: Secondary | ICD-10-CM

## 2019-04-09 DIAGNOSIS — Z0000 Encounter for general adult medical examination without abnormal findings: Secondary | ICD-10-CM

## 2019-04-09 DIAGNOSIS — Z1211 Encounter for screening for malignant neoplasm of colon: Secondary | ICD-10-CM | POA: Diagnosis not present

## 2019-04-09 NOTE — Patient Instructions (Signed)
Jill Larsen , Thank you for taking time to come for your Medicare Wellness Visit. I appreciate your ongoing commitment to your health goals. Please review the following plan we discussed and let me know if I can assist you in the future.   Screening recommendations/referrals: Colonoscopy: due - referral placed to gastroenterology today Mammogram: due. Please call [phone] to schedule your mammogram and bone density screening.   Recommended yearly ophthalmology/optometry visit for glaucoma screening and checkup Recommended yearly dental visit for hygiene and checkup  Vaccinations: Influenza vaccine: postponed Pneumococcal vaccine: postponed Tdap vaccine: postponed Shingles vaccine: Shingrix discussed. Please contact your pharmacy for coverage information.   Advanced directives: Advance directive discussed with you today. Even though you declined this today please call our office should you change your mind and we can give you the proper paperwork for you to fill out.  Conditions/risks identified: Recommend checking blood sugars to help control diabetes. Please contact us if you are interested in diabetes education or management from our office.   Next appointment: Please follow up in one year for your Medicare Annual Wellness visit.     Preventive Care 42 Years and Older, Female Preventive care refers to lifestyle choices and visits with your health care provider that can promote health and wellness. What does preventive care include?  A yearly physical exam. This is also called an annual well check.  Dental exams once or twice a year.  Routine eye exams. Ask your health care provider how often you should have your eyes checked.  Personal lifestyle choices, including:  Daily care of your teeth and gums.  Regular physical activity.  Eating a healthy diet.  Avoiding tobacco and drug use.  Limiting alcohol use.  Practicing safe sex.  Taking low-dose aspirin every  day.  Taking vitamin and mineral supplements as recommended by your health care provider. What happens during an annual well check? The services and screenings done by your health care provider during your annual well check will depend on your age, overall health, lifestyle risk factors, and family history of disease. Counseling  Your health care provider may ask you questions about your:  Alcohol use.  Tobacco use.  Drug use.  Emotional well-being.  Home and relationship well-being.  Sexual activity.  Eating habits.  History of falls.  Memory and ability to understand (cognition).  Work and work Astronomer.  Reproductive health. Screening  You may have the following tests or measurements:  Height, weight, and BMI.  Blood pressure.  Lipid and cholesterol levels. These may be checked every 5 years, or more frequently if you are over 88 years old.  Skin check.  Lung cancer screening. You may have this screening every year starting at age 64 if you have a 30-pack-year history of smoking and currently smoke or have quit within the past 15 years.  Fecal occult blood test (FOBT) of the stool. You may have this test every year starting at age 49.  Flexible sigmoidoscopy or colonoscopy. You may have a sigmoidoscopy every 5 years or a colonoscopy every 10 years starting at age 29.  Hepatitis C blood test.  Hepatitis B blood test.  Sexually transmitted disease (STD) testing.  Diabetes screening. This is done by checking your blood sugar (glucose) after you have not eaten for a while (fasting). You may have this done every 1-3 years.  Bone density scan. This is done to screen for osteoporosis. You may have this done starting at age 87.  Mammogram. This may be done  every 1-2 years. Talk to your health care provider about how often you should have regular mammograms. Talk with your health care provider about your test results, treatment options, and if necessary, the need  for more tests. Vaccines  Your health care provider may recommend certain vaccines, such as:  Influenza vaccine. This is recommended every year.  Tetanus, diphtheria, and acellular pertussis (Tdap, Td) vaccine. You may need a Td booster every 10 years.  Zoster vaccine. You may need this after age 31.  Pneumococcal 13-valent conjugate (PCV13) vaccine. One dose is recommended after age 50.  Pneumococcal polysaccharide (PPSV23) vaccine. One dose is recommended after age 64. Talk to your health care provider about which screenings and vaccines you need and how often you need them. This information is not intended to replace advice given to you by your health care provider. Make sure you discuss any questions you have with your health care provider. Document Released: 03/31/2015 Document Revised: 11/22/2015 Document Reviewed: 01/03/2015 Elsevier Interactive Patient Education  2017 [employer] Prevention in the Home Falls can cause injuries. They can happen to people of all ages. There are many things you can do to make your home safe and to help prevent falls. What can I do on the outside of my home?  Regularly fix the edges of walkways and driveways and fix any cracks.  Remove anything that might make you trip as you walk through a door, such as a raised step or threshold.  Trim any bushes or trees on the path to your home.  Use bright outdoor lighting.  Clear any walking paths of anything that might make someone trip, such as rocks or tools.  Regularly check to see if handrails are loose or broken. Make sure that both sides of any steps have handrails.  Any raised decks and porches should have guardrails on the edges.  Have any leaves, snow, or ice cleared regularly.  Use sand or salt on walking paths during winter.  Clean up any spills in your garage right away. This includes oil or grease spills. What can I do in the bathroom?  Use night lights.  Install grab bars  by the toilet and in the tub and shower. Do not use towel bars as grab bars.  Use non-skid mats or decals in the tub or shower.  If you need to sit down in the shower, use a plastic, non-slip stool.  Keep the floor dry. Clean up any water that spills on the floor as soon as it happens.  Remove soap buildup in the tub or shower regularly.  Attach bath mats securely with double-sided non-slip rug tape.  Do not have throw rugs and other things on the floor that can make you trip. What can I do in the bedroom?  Use night lights.  Make sure that you have a light by your bed that is easy to reach.  Do not use any sheets or blankets that are too big for your bed. They should not hang down onto the floor.  Have a firm chair that has side arms. You can use this for support while you get dressed.  Do not have throw rugs and other things on the floor that can make you trip. What can I do in the kitchen?  Clean up any spills right away.  Avoid walking on wet floors.  Keep items that you use a lot in easy-to-reach places.  If you need to reach something above you, use a  strong step stool that has a grab bar.  Keep electrical cords out of the way.  Do not use floor polish or wax that makes floors slippery. If you must use wax, use non-skid floor wax.  Do not have throw rugs and other things on the floor that can make you trip. What can I do with my stairs?  Do not leave any items on the stairs.  Make sure that there are handrails on both sides of the stairs and use them. Fix handrails that are broken or loose. Make sure that handrails are as long as the stairways.  Check any carpeting to make sure that it is firmly attached to the stairs. Fix any carpet that is loose or worn.  Avoid having throw rugs at the top or bottom of the stairs. If you do have throw rugs, attach them to the floor with carpet tape.  Make sure that you have a light switch at the top of the stairs and the  bottom of the stairs. If you do not have them, ask someone to add them for you. What else can I do to help prevent falls?  Wear shoes that:  Do not have high heels.  Have rubber bottoms.  Are comfortable and fit you well.  Are closed at the toe. Do not wear sandals.  If you use a stepladder:  Make sure that it is fully opened. Do not climb a closed stepladder.  Make sure that both sides of the stepladder are locked into place.  Ask someone to hold it for you, if possible.  Clearly mark and make sure that you can see:  Any grab bars or handrails.  First and last steps.  Where the edge of each step is.  Use tools that help you move around (mobility aids) if they are needed. These include:  Canes.  Walkers.  Scooters.  Crutches.  Turn on the lights when you go into a dark area. Replace any light bulbs as soon as they burn out.  Set up your furniture so you have a clear path. Avoid moving your furniture around.  If any of your floors are uneven, fix them.  If there are any pets around you, be aware of where they are.  Review your medicines with your doctor. Some medicines can make you feel dizzy. This can increase your chance of falling. Ask your doctor what other things that you can do to help prevent falls. This information is not intended to replace advice given to you by your health care provider. Make sure you discuss any questions you have with your health care provider. Document Released: 12/29/2008 Document Revised: 08/10/2015 Document Reviewed: 04/08/2014 Elsevier Interactive Patient Education  2017 [employer].

## 2019-04-09 NOTE — Progress Notes (Signed)
Subjective:   Jill Larsen is a 69 y.o. female who presents for an Initial Medicare Annual Wellness Visit.  Virtual Visit via Telephone Note  I connected with [name] on 04/09/19 at  8:00 AM EST by telephone and verified that I am speaking with the correct person using two identifiers.  Medicare Annual Wellness visit completed telephonically due to Covid-19 pandemic.   Location: Patient: home Provider: office   I discussed the limitations, risks, security and privacy concerns of performing an evaluation and management service by telephone and the availability of in person appointments. The patient expressed understanding and agreed to proceed.  Some vital signs may be absent or patient reported.   Clemetine Marker, LPN    Review of Systems      Cardiac Risk Factors include: diabetes mellitus;advanced age (>97mn, >>78women);hypertension;obesity (BMI >30kg/m2);sedentary lifestyle     Objective:    There were no vitals filed for this visit. There is no height or weight on file to calculate BMI.  Advanced Directives 04/09/2019 01/21/2019 01/20/2019  Does Patient Have a Medical Advance Directive? No No No  Would patient like information on creating a medical advance directive? No - Patient declined No - Patient declined No - Patient declined    Current Medications (verified) Outpatient Encounter Medications as of 04/09/2019  Medication Sig  . benazepril (LOTENSIN) 40 MG tablet Take by mouth.  .Marland KitchenamLODipine (NORVASC) 10 MG tablet Take 1 tablet (10 mg total) by mouth daily.  . blood glucose meter kit and supplies KIT Dispense based on patient and insurance preference. Use up to four times daily for blood sugar monitoring. (FOR Sx IDDM ICD-10-E11.65).  .Marland Kitchenbrimonidine (ALPHAGAN) 0.2 % ophthalmic solution Place 1 drop into the right eye 2 (two) times daily.  . carvedilol (COREG) 6.25 MG tablet Take 6.25 mg by mouth 2 (two) times daily.  . citalopram (CELEXA) 20 MG tablet Take 1 tablet  (20 mg total) by mouth daily.  . cloNIDine (CATAPRES) 0.2 MG tablet Take 0.2 mg by mouth 2 (two) times daily.  . hydrALAZINE (APRESOLINE) 100 MG tablet Take 1 tablet (100 mg total) by mouth 3 (three) times daily.  . hydrOXYzine (ATARAX/VISTARIL) 10 MG tablet Take 1 tablet (10 mg total) by mouth 3 (three) times daily as needed for anxiety.  . insulin detemir (LEVEMIR) 100 UNIT/ML injection Inject 25 Units into the skin 2 (two) times daily.  . Insulin Detemir (LEVEMIR) 100 UNIT/ML Pen Inject 10-30 Units into the skin 2 (two) times daily.  . Insulin Pen Needle 32G X 4 MM MISC Use as directed with insulin daily  . lisinopril-hydrochlorothiazide (ZESTORETIC) 20-12.5 MG tablet Take 1 tablet by mouth daily.  . metFORMIN (GLUCOPHAGE) 1000 MG tablet Take 1 tablet (1,000 mg total) by mouth 2 (two) times daily.  . prednisoLONE acetate (PRED FORTE) 1 % ophthalmic suspension Place 1 drop into the right eye 3 (three) times daily.   No facility-administered encounter medications on file as of 04/09/2019.    Allergies (verified) Patient has no known allergies.   History: Past Medical History:  Diagnosis Date  . Anxiety   . Blind   . Depression   . Diabetes mellitus without complication [place]   . Hypertension    Past Surgical History:  Procedure Laterality Date  . EYE SURGERY     Family History  Problem Relation Age of Onset  . Stroke Mother   . Diabetes type II Mother   . Stroke Father   . Diabetes Mellitus II  Brother   . Cancer Sister   . Diabetes Son   . Diabetes Mellitus II Brother   . Kidney disease Brother   . Prostate cancer Brother    Social History   Socioeconomic History  . Marital status: Widowed    Spouse name: Not on file  . Number of children: 3  . Years of education: 72  . Highest education level: Some college, no degree  Occupational History  . Not on file  Tobacco Use  . Smoking status: Never Smoker  . Smokeless tobacco: Never Used  Substance and Sexual Activity    . Alcohol use: Never  . Drug use: Never  . Sexual activity: Not Currently  Other Topics Concern  . Not on file  Social History Narrative  . Not on file   Social Determinants of Health   Financial Resource Strain: Low Risk   . Difficulty of Paying Living Expenses: Not hard at all  Food Insecurity: No Food Insecurity  . Worried About Charity fundraiser in the Last Year: Never true  . Ran Out of Food in the Last Year: Never true  Transportation Needs: No Transportation Needs  . Lack of Transportation (Medical): No  . Lack of Transportation (Non-Medical): No  Physical Activity: Inactive  . Days of Exercise per Week: 0 days  . Minutes of Exercise per Session: 0 min  Stress: No Stress Concern Present  . Feeling of Stress : Not at all  Social Connections: Moderately Isolated  . Frequency of Communication with Friends and Family: More than three times a week  . Frequency of Social Gatherings with Friends and Family: More than three times a week  . Attends Religious Services: Never  . Active Member of Clubs or Organizations: No  . Attends Archivist Meetings: Never  . Marital Status: Widowed    Tobacco Counseling Counseling given: Not Answered   Clinical Intake:  Pre-visit preparation completed: Yes  Pain : No/denies pain     Nutritional Status: BMI > 30  Obese Nutritional Risks: None Diabetes: Yes CBG done?: No Did pt. bring in CBG monitor from home?: No   Nutrition Risk Assessment:  Has the patient had any N/V/D within the last 2 months?  Yes - diarrhea from metformin Does the patient have any non-healing wounds?  Yes  - foot ulcer, followed by podiatry but improving per patient Has the patient had any unintentional weight loss or weight gain?  No   Diabetes:  Is the patient diabetic?  Yes  If diabetic, was a CBG obtained today?  No  Did the patient bring in their glucometer from home?  No  How often do you monitor your CBG's? Pt has glucometer but  has not started checking blood sugar.   Financial Strains and Diabetes Management:  Are you having any financial strains with the device, your supplies or your medication? No .  Does the patient want to be seen by Chronic Care Management for management of their diabetes?  No  Would the patient like to be referred to a Nutritionist or for Diabetic Management?  No   Diabetic Exams:  Diabetic Eye Exam: Completed 01/05/19.   Diabetic Foot Exam: Completed 03/17/19.   How often do you need to have someone help you when you read instructions, pamphlets, or other written materials from your doctor or pharmacy?: 5 - Always(blind)  Interpreter Needed?: No  Information entered by :: Clemetine Marker LPN   Activities of Daily Living In your present  state of health, do you have any difficulty performing the following activities: 04/09/2019 03/17/2019  Hearing? N N  Comment declins hearing aids -  Vision? Y Y  Difficulty concentrating or making decisions? N N  Walking or climbing stairs? Y Y  Dressing or bathing? N N  Doing errands, shopping? N Y  Conservation officer, nature and eating ? N -  Using the Toilet? N -  In the past six months, have you accidently leaked urine? N -  Do you have problems with loss of bowel control? N -  Managing your Medications? N -  Managing your Finances? N -  Housekeeping or managing your Housekeeping? N -     Immunizations and Health Maintenance  There is no immunization history on file for this patient. Health Maintenance Due  Topic Date Due  . Hepatitis C Screening  11-16-1950  . MAMMOGRAM  01/05/2003  . COLONOSCOPY  01/05/2003  . DEXA SCAN  01/04/2018    Patient Care Team: Delsa Grana, PA-C as PCP - General (Family Medicine)  Indicate any recent Medical Services you may have received from other than [facility] providers in the past year (date may be approximate).     Assessment:   This is a routine wellness examination for [facility].  Hearing/Vision screen   Hearing Screening   '125Hz'$  '250Hz'$  '500Hz'$  '1000Hz'$  '2000Hz'$  '3000Hz'$  '4000Hz'$  '6000Hz'$  '8000Hz'$   Right ear:           Left ear:           Comments: Pt denies hearing difficulty  Vision Screening Comments: Pt has eye dr in [place], previously in [place]. Pt is blind.   Dietary issues and exercise activities discussed: Current Exercise Habits: The patient does not participate in regular exercise at present, Exercise limited by: Other - see comments(blind)  Goals    . Patient Stated     Pt states she would like to take better care of herself, eat a healthier diet and be more compliant with dr appts.       Depression Screen PHQ 2/9 Scores 04/09/2019 03/17/2019 02/18/2019 02/04/2019  PHQ - 2 Score 0 0 0 0  PHQ- 9 Score - 0 0 0    Fall Risk Fall Risk  04/09/2019 03/17/2019 02/18/2019 02/04/2019  Falls in the past year? 1 1 0 1  Number falls in past yr: 1 1 0 1  Injury with Fall? 0 0 0 0  Risk for fall due to : History of fall(s);Impaired vision - - -  Follow up Falls prevention discussed - - -    FALL RISK PREVENTION PERTAINING TO THE HOME:  Any stairs in or around the home? Yes  If so, do they handrails? Yes   Home free of loose throw rugs in walkways, pet beds, electrical cords, etc? Yes  Adequate lighting in your home to reduce risk of falls? Yes   ASSISTIVE DEVICES UTILIZED TO PREVENT FALLS:  Life alert? No  Use of a cane, walker or w/c? No  Grab bars in the bathroom? No  Shower chair or bench in shower? No  Elevated toilet seat or a handicapped toilet? No   DME ORDERS:  DME order needed?  No   TIMED UP AND GO:  Was the test performed? No . Telephonic visit.   Education: Fall risk prevention has been discussed.  Intervention(s) required? No   Cognitive Function:     6CIT Screen 04/09/2019  What Year? 0 points  What month? 0 points  What time? 0 points  Count back from 20 0 points  Months in reverse 4 points  Repeat phrase 4 points  Total Score 8    Screening  Tests Health Maintenance  Topic Date Due  . Hepatitis C Screening  24-Jan-1951  . MAMMOGRAM  01/05/2003  . COLONOSCOPY  01/05/2003  . DEXA SCAN  01/04/2018  . INFLUENZA VACCINE  06/16/2019 (Originally 10/17/2018)  . TETANUS/TDAP  03/16/2020 (Originally 01/05/1972)  . PNA vac Low Risk Adult (1 of 2 - PCV13) 03/18/2020 (Originally 01/04/2018)  . HEMOGLOBIN A1C  07/21/2019  . OPHTHALMOLOGY EXAM  01/05/2020  . FOOT EXAM  03/16/2020    Qualifies for Shingles Vaccine?  Yes  . Due for Shingrix. Education has been provided regarding the importance of this vaccine. Pt has been advised to call insurance company to determine out of pocket expense. Advised may also receive vaccine at local pharmacy or Health Dept. Verbalized acceptance and understanding.  Tdap: Although this vaccine is not a covered service during a Wellness Exam, does the patient still wish to receive this vaccine today?  No .  Education has been provided regarding the importance of this vaccine. Advised may receive this vaccine at local pharmacy or Health Dept. Aware to provide a copy of the vaccination record if obtained from local pharmacy or Health Dept. Verbalized acceptance and understanding.  Flu Vaccine: Due for Flu vaccine. Does the patient want to receive this vaccine today?  No . Education has been provided regarding the importance of this vaccine but still declined. Advised may receive this vaccine at local pharmacy or Health Dept. Aware to provide a copy of the vaccination record if obtained from local pharmacy or Health Dept. Verbalized acceptance and understanding.  Pneumococcal Vaccine: Due for Pneumococcal vaccine. Does the patient want to receive this vaccine today?  No . Education has been provided regarding the importance of this vaccine but still declined. Advised may receive this vaccine at local pharmacy or Health Dept. Aware to provide a copy of the vaccination record if obtained from local pharmacy or Health Dept.  Verbalized acceptance and understanding.'  Cancer Screenings:  Colorectal Screening: Not completed. Referral to GI placed today. Pt aware the office will call re: appt.  Mammogram: Due. Ordered today. Pt provided with contact information and advised to call to schedule appt.   Bone Density: Due. Ordered today. Pt provided with contact information and advised to call to schedule appt.   Lung Cancer Screening: (Low Dose CT Chest recommended if Age 101-80 years, 30 pack-year currently smoking OR have quit w/in 15years.) does not qualify.   Additional Screening:  Hepatitis C Screening: does qualify; postponed  Vision Screening: Recommended annual ophthalmology exams for early detection of glaucoma and other disorders of the eye. Is the patient up to date with their annual eye exam?  Yes  Who is the provider or what is the name of the office in which the pt attends annual eye exams? Dr. Manuella Ghazi  Dental Screening: Recommended annual dental exams for proper oral hygiene  Community Resource Referral:  CRR required this visit?  No      Plan:    I have personally reviewed and addressed the Medicare Annual Wellness questionnaire and have noted the following in the patient's chart:  A. Medical and social history B. Use of alcohol, tobacco or illicit drugs  C. Current medications and supplements D. Functional ability and status E.  Nutritional status F.  Physical activity G. Advance directives H. List of other physicians I.  Hospitalizations, surgeries,  and ER visits in previous 12 months J.  [place] such as hearing and vision if needed, cognitive and depression L. Referrals and appointments   In addition, I have reviewed and discussed with patient certain preventive protocols, quality metrics, and best practice recommendations. A written personalized care plan for preventive services as well as general preventive health recommendations were provided to patient.   Signed,    Clemetine Marker, LPN Nurse Health Advisor   Nurse Notes: pt states ulcer on right foot is getting better and has another follow up with podiatry next week. Pt very pleasant and appreciative of visit today. Due to her vision she was not able to review medications and her daughter was not home to assist. Advised patient to bring to next visit in office. Pt also reported that she now has a glucometer but has not started checking blood sugar yet. Offered referral to CCM for disease management and education, pt declined for now.

## 2019-04-10 ENCOUNTER — Other Ambulatory Visit: Payer: Self-pay | Admitting: Family Medicine

## 2019-04-10 DIAGNOSIS — I10 Essential (primary) hypertension: Secondary | ICD-10-CM

## 2019-04-20 ENCOUNTER — Encounter: Payer: Self-pay | Admitting: *Deleted

## 2019-04-23 ENCOUNTER — Other Ambulatory Visit: Payer: Self-pay

## 2019-04-23 ENCOUNTER — Encounter: Payer: Self-pay | Admitting: Family Medicine

## 2019-04-23 ENCOUNTER — Ambulatory Visit (INDEPENDENT_AMBULATORY_CARE_PROVIDER_SITE_OTHER): Payer: Medicare Other | Admitting: Family Medicine

## 2019-04-23 VITALS — BP 145/72 | HR 78 | Ht 63.0 in | Wt 216.0 lb

## 2019-04-23 DIAGNOSIS — I129 Hypertensive chronic kidney disease with stage 1 through stage 4 chronic kidney disease, or unspecified chronic kidney disease: Secondary | ICD-10-CM | POA: Diagnosis not present

## 2019-04-23 DIAGNOSIS — Z794 Long term (current) use of insulin: Secondary | ICD-10-CM

## 2019-04-23 DIAGNOSIS — L97519 Non-pressure chronic ulcer of other part of right foot with unspecified severity: Secondary | ICD-10-CM | POA: Diagnosis not present

## 2019-04-23 DIAGNOSIS — I10 Essential (primary) hypertension: Secondary | ICD-10-CM

## 2019-04-23 DIAGNOSIS — R0989 Other specified symptoms and signs involving the circulatory and respiratory systems: Secondary | ICD-10-CM | POA: Diagnosis not present

## 2019-04-23 DIAGNOSIS — E1139 Type 2 diabetes mellitus with other diabetic ophthalmic complication: Secondary | ICD-10-CM | POA: Diagnosis not present

## 2019-04-23 DIAGNOSIS — Z5181 Encounter for therapeutic drug level monitoring: Secondary | ICD-10-CM

## 2019-04-23 DIAGNOSIS — E11311 Type 2 diabetes mellitus with unspecified diabetic retinopathy with macular edema: Secondary | ICD-10-CM

## 2019-04-23 DIAGNOSIS — N1831 Chronic kidney disease, stage 3a: Secondary | ICD-10-CM

## 2019-04-23 MED ORDER — INSULIN PEN NEEDLE 32G X 4 MM MISC: 3 refills | Status: DC

## 2019-04-23 NOTE — Progress Notes (Signed)
Name: Jill Larsen   MRN: [id number]    DOB: Sep 05, 1950   Date:04/23/2019       Progress Note  Subjective:    Chief Complaint  Chief Complaint  Patient presents with  . Follow-up  . Diabetes    BG 127  . Hypertension    I connected with  [name]  on 04/23/19 at  1:40 PM EST by a video enabled telemedicine application and verified that I am speaking with the correct person using two identifiers.  I discussed the limitations of evaluation and management by telemedicine and the availability of in person appointments. The patient expressed understanding and agreed to proceed. Staff also discussed with the patient that there may be a patient responsible charge related to this service. Patient Location: home Provider Location: [facility] Additional Individuals present: DIL   HPI  HTN-  Currently taking hydralazine 100 mg 3 times daily, Coreg 6.25 twice daily, amlodipine 10 mg, lisinopril HCTZ 20-12.5 BP Readings from Last 10 Encounters:  04/23/19 (!) 145/72  03/17/19 (!) 148/92  02/18/19 (!) 196/84  02/04/19 (!) 184/86  01/23/19 (!) 169/86  Pt est here Nov 2020 - Patient notes a long history of hypertension,  HTN:  For the last several years BP has been difficult to control with her PCP SBP 200's, recent hospitalization for the same they could not discharge her for several days due to resistant hypertension   DM-  25 units levemir BID and metformin 1000 mg  CBGs 97, low recently of 60 she felt sweaty, has been giving insulin for months without meter despite multiple Rx for meter and strongly urging the necessity of monitoring cbg with insulin. Lab Results  Component Value Date   HGBA1C 9.5 (H) 01/21/2019   Patient was due today for an office visit for in person evaluation, blood work  She has gone to podiatry and the ulcer on her foot is improving, feels much better   Patient Active Problem List   Diagnosis Date Noted  . AKI (acute kidney injury) ([place]) 01/20/2019  .  Diabetes mellitus without complication ([place])   . Hypertension   . Hypertensive emergency   . Hyperkalemia   . Diabetic macular edema ([place]) 05/21/2018  . 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 Vitreous hemorrhage of right eye ([place]) 02/14/2017    Social History   Tobacco Use  . Smoking status: Never Smoker  . Smokeless tobacco: Never Used  Substance Use Topics  . Alcohol use: Never     Current Outpatient Medications:  .  amLODipine (NORVASC) 10 MG tablet, Take 1 tablet (10 mg total) by mouth daily., Disp: 90 tablet, Rfl: 1 .  benazepril (LOTENSIN) 40 MG tablet, Take by mouth., Disp: , Rfl:  .  brimonidine (ALPHAGAN) 0.2 % ophthalmic solution, Place 1 drop into the right eye 2 (two) times daily., Disp: , Rfl:  .  carvedilol (COREG) 6.25 MG tablet, Take 6.25 mg by mouth 2 (two) times daily., Disp: , Rfl:  .  citalopram (CELEXA) 20 MG tablet, Take 1 tablet (20 mg total) by mouth daily., Disp: 90 tablet, Rfl: 3 .  cloNIDine (CATAPRES) 0.2 MG tablet, Take 0.2 mg by mouth 2 (two) times daily., Disp: , Rfl:  .  hydrALAZINE (APRESOLINE) 100 MG tablet, Take 1 tablet (100 mg total) by mouth 3 (three) times daily., Disp: 90 tablet, Rfl: 2 .  hydrOXYzine (ATARAX/VISTARIL) 10 MG tablet, Take 1 tablet (10 mg total) by mouth 3 (three) times daily as needed for anxiety., Disp: 90 tablet, Rfl: 1 .  insulin detemir (LEVEMIR) 100 UNIT/ML injection, Inject 25 Units into the skin 2 (two) times daily., Disp: , Rfl:  .  lisinopril-hydrochlorothiazide (ZESTORETIC) 20-12.5 MG tablet, Take 1 tablet by mouth once daily, Disp: 30 tablet, Rfl: 0 .  metFORMIN (GLUCOPHAGE) 1000 MG tablet, Take 1 tablet (1,000 mg total) by mouth 2 (two) times daily., Disp: 180 tablet, Rfl: 3 .  prednisoLONE acetate (PRED FORTE) 1 % ophthalmic suspension, Place 1 drop into the right eye 3 (three)  times daily., Disp: , Rfl:  .  blood glucose meter kit and supplies KIT, Dispense based on patient and insurance preference. Use up to four times daily for blood sugar monitoring. (FOR Sx IDDM ICD-10-E11.65)., Disp: 1 each, Rfl: 0 .  Insulin Detemir (LEVEMIR) 100 UNIT/ML Pen, Inject 10-30 Units into the skin 2 (two) times daily. (Patient not taking: Reported on 04/23/2019), Disp: 15 mL, Rfl: 6 .  Insulin Pen Needle 32G X 4 MM MISC, Use as directed with insulin daily, Disp: 100 each, Rfl: 1  No Known Allergies  I personally reviewed active problem list, medication list, allergies, family history, social history, health maintenance, notes from last encounter, lab results, imaging with the patient/caregiver today.   Review of Systems  Constitutional: Negative.   HENT: Negative.   Eyes: Negative.   Respiratory: Negative.   Cardiovascular: Negative.   Gastrointestinal: Negative.   Endocrine: Negative.   Genitourinary: Negative.   Musculoskeletal: Negative.   Skin: Negative.   Allergic/Immunologic: Negative.   Neurological: Negative.   Hematological: Negative.   Psychiatric/Behavioral: Negative.   All other systems reviewed and are negative.     Objective:   Virtual encounter, vitals limited, only able to obtain the following Today's Vitals   04/23/19 1107  BP: (!) 145/72  Pulse: 78  Weight: 216 lb (98 kg)  Height: '5\' 3"'$  (1.6 m)   Body mass index is 38.26 kg/m. Nursing Note and Vital Signs reviewed.  Physical Exam Vitals and nursing note reviewed.  Constitutional:      General: She is not in acute distress.    Appearance: Normal appearance. She is well-developed. She is not ill-appearing, toxic-appearing or diaphoretic.  HENT:     Head: Normocephalic and atraumatic.  Eyes:     General:        Right eye: No discharge.        Left eye: No discharge.     Conjunctiva/sclera: Conjunctivae normal.  Neck:     Trachea: No tracheal deviation.  Cardiovascular:     Rate and  Rhythm: Normal rate.  Pulmonary:     Effort: Pulmonary effort is normal. No respiratory distress.     Breath sounds: No stridor.  Skin:    Coloration: Skin is not jaundiced or pale.     Findings: No rash.  Neurological:     Mental Status: She is alert.  Psychiatric:        Mood and Affect: Mood normal.        Behavior: Behavior normal.     PE limited by telephone encounter  No results found for this or any previous visit (from the past 72 hour(s)).  Assessment and Plan:     ICD-10-CM   1. Hypertension, unspecified type  I10 Ambulatory referral to Cardiology    COMPLETE METABOLIC PANEL WITH GFR   improving, near goal  2.  Type 2 diabetes mellitus with other ophthalmic complication, with long-term current use of insulin (HCC)  E11.39 Insulin Pen Needle 32G X 4 MM MISC   R43.2 COMPLETE METABOLIC PANEL WITH GFR    Hemoglobin A1c   decrease insulin dose due to hypoglycemia, decrease bedtime dose to 20 units, can continue to do 25 u in am, monitor cbg, if low, decrease levemir again  3. Labile hypertension  R09.89 Ambulatory referral to Cardiology    COMPLETE METABOLIC PANEL WITH GFR  4. Ulcer of right foot, unspecified ulcer stage ([place])  L97.519    per podiatry, improving  5. Diabetic macular edema (HCC)  E11.311   6. Stage 3a chronic kidney disease  W03.79 COMPLETE METABOLIC PANEL WITH GFR   recheck labs  7. Encounter for medication monitoring  Z51.81 CBC with Differential/Platelet    COMPLETE METABOLIC PANEL WITH GFR    Lipid panel    Hemoglobin A1c     -Red flags and when to present for emergency care or RTC including fever >101.14F, chest pain, shortness of breath, new/worsening/un-resolving symptoms, reviewed with patient at time of visit. Follow up and care instructions discussed and provided in AVS. - I discussed the assessment and treatment plan with the patient. The patient was provided an opportunity to ask questions and all were answered. The patient agreed with the  plan and demonstrated an understanding of the instructions.  I provided 18 minutes of non-face-to-face time during this encounter.  Delsa Grana, PA-C 04/23/19 2:14 PM

## 2019-04-28 DIAGNOSIS — R0989 Other specified symptoms and signs involving the circulatory and respiratory systems: Secondary | ICD-10-CM | POA: Insufficient documentation

## 2019-04-28 DIAGNOSIS — IMO0002 Reserved for concepts with insufficient information to code with codable children: Secondary | ICD-10-CM | POA: Insufficient documentation

## 2019-04-28 DIAGNOSIS — E1165 Type 2 diabetes mellitus with hyperglycemia: Secondary | ICD-10-CM | POA: Insufficient documentation

## 2019-04-28 DIAGNOSIS — L97519 Non-pressure chronic ulcer of other part of right foot with unspecified severity: Secondary | ICD-10-CM | POA: Insufficient documentation

## 2019-04-28 DIAGNOSIS — E119 Type 2 diabetes mellitus without complications: Secondary | ICD-10-CM | POA: Insufficient documentation

## 2019-04-29 ENCOUNTER — Other Ambulatory Visit: Payer: Self-pay

## 2019-04-29 ENCOUNTER — Telehealth: Payer: Self-pay | Admitting: Family Medicine

## 2019-04-29 ENCOUNTER — Encounter: Payer: Self-pay | Admitting: Cardiology

## 2019-04-29 ENCOUNTER — Ambulatory Visit (INDEPENDENT_AMBULATORY_CARE_PROVIDER_SITE_OTHER): Payer: Medicare Other | Admitting: Cardiology

## 2019-04-29 VITALS — BP 163/83 | HR 87 | Ht 63.0 in | Wt 216.5 lb

## 2019-04-29 DIAGNOSIS — E1139 Type 2 diabetes mellitus with other diabetic ophthalmic complication: Secondary | ICD-10-CM

## 2019-04-29 DIAGNOSIS — I10 Essential (primary) hypertension: Secondary | ICD-10-CM

## 2019-04-29 DIAGNOSIS — R609 Edema, unspecified: Secondary | ICD-10-CM

## 2019-04-29 DIAGNOSIS — R011 Cardiac murmur, unspecified: Secondary | ICD-10-CM | POA: Diagnosis not present

## 2019-04-29 DIAGNOSIS — E1165 Type 2 diabetes mellitus with hyperglycemia: Secondary | ICD-10-CM

## 2019-04-29 MED ORDER — INSULIN DETEMIR 100 UNIT/ML FLEXPEN: 10.0000 [IU] | PEN_INJECTOR | Freq: Two times a day (BID) | SUBCUTANEOUS | 6 refills | Status: DC

## 2019-04-29 MED ORDER — CARVEDILOL 12.5 MG PO TABS: 12.5000 mg | ORAL_TABLET | Freq: Two times a day (BID) | ORAL | 3 refills | Status: DC

## 2019-04-29 MED ORDER — LISINOPRIL-HYDROCHLOROTHIAZIDE 20-12.5 MG PO TABS: 1.0000 | ORAL_TABLET | Freq: Two times a day (BID) | ORAL | 1 refills | Status: DC

## 2019-04-29 NOTE — Telephone Encounter (Signed)
Daughter notified 

## 2019-04-29 NOTE — Patient Instructions (Signed)
Medication Instructions:  1- INCREASE Coreg 12.5 by mouth two times a day. 2- INCREASE Zestoretic to 20/12.5 mg by mouth two times a day  *If you need a refill on your cardiac medications before your next appointment, please call your pharmacy*  Lab Work: none If you have labs (blood work) drawn today and your tests are completely normal, you will receive your results only by: [facility] MyChart Message (if you have MyChart) OR . A paper copy in the mail If you have any lab test that is abnormal or we need to change your treatment, we will call you to review the results.  Testing/Procedures: Your physician has requested that you have an echocardiogram. Echocardiography is a painless test that uses sound waves to create images of your heart. It provides your doctor with information about the size and shape of your heart and how well your heart's chambers and valves are working. This procedure takes approximately one hour. There are no restrictions for this procedure. You may get an IV, if needed, to receive an ultrasound enhancing agent through to better visualize your heart.    Follow-Up: At [facility], you and your health needs are our priority.  As part of our continuing mission to provide you with exceptional heart care, we have created designated Provider Care Teams.  These Care Teams include your primary Cardiologist (physician) and Advanced Practice Providers (APPs -  Physician Assistants and Nurse Practitioners) who all work together to provide you with the care you need, when you need it.  Your next appointment:   1 month(s)  The format for your next appointment:   In Person  Provider:   Kate Sable, MD    Echocardiogram An echocardiogram is a procedure that uses painless sound waves (ultrasound) to produce an image of the heart. Images from an echocardiogram can provide important information about:  Signs of coronary artery disease (CAD).  Aneurysm detection. An aneurysm  is a weak or damaged part of an artery wall that bulges out from the normal force of blood pumping through the body.  Heart size and shape. Changes in the size or shape of the heart can be associated with certain conditions, including heart failure, aneurysm, and CAD.  Heart muscle function.  Heart valve function.  Signs of a past heart attack.  Fluid buildup around the heart.  Thickening of the heart muscle.  A tumor or infectious growth around the heart valves. Tell a health care provider about:  Any allergies you have.  All medicines you are taking, including vitamins, herbs, eye drops, creams, and over-the-counter medicines.  Any blood disorders you have.  Any surgeries you have had.  Any medical conditions you have.  Whether you are pregnant or may be pregnant. What are the risks? Generally, this is a safe procedure. However, problems may occur, including:  Allergic reaction to dye (contrast) that may be used during the procedure. What happens before the procedure? No specific preparation is needed. You may eat and drink normally. What happens during the procedure?   An IV tube may be inserted into one of your veins.  You may receive contrast through this tube. A contrast is an injection that improves the quality of the pictures from your heart.  A gel will be applied to your chest.  A wand-like tool (transducer) will be moved over your chest. The gel will help to transmit the sound waves from the transducer.  The sound waves will harmlessly bounce off of your heart to  allow the heart images to be captured in real-time motion. The images will be recorded on a computer. The procedure may vary among health care providers and hospitals. What happens after the procedure?  You may return to your normal, everyday life, including diet, activities, and medicines, unless your health care provider tells you not to do that. Summary  An echocardiogram is a procedure that  uses painless sound waves (ultrasound) to produce an image of the heart.  Images from an echocardiogram can provide important information about the size and shape of your heart, heart muscle function, heart valve function, and fluid buildup around your heart.  You do not need to do anything to prepare before this procedure. You may eat and drink normally.  After the echocardiogram is completed, you may return to your normal, everyday life, unless your health care provider tells you not to do that. This information is not intended to replace advice given to you by your health care provider. Make sure you discuss any questions you have with your health care provider. Document Revised: 06/25/2018 Document Reviewed: 04/06/2016 Elsevier Patient Education  2020 [employer].

## 2019-04-29 NOTE — Telephone Encounter (Signed)
RX REFILL insulin detemir (LEVEMIR) 100 UNIT/ML injection  PHARMACY [facility] [place], [place] - [address] Phone:  [phone]  Fax:  [phone]      Patient daughter called stating  Patient is also requesting to get nasal spray. Patient states she spoke to PCP regarding at last appt. Call back [phone]

## 2019-04-29 NOTE — Progress Notes (Signed)
Cardiology Office Note:    Date:  04/29/2019   ID:  Jill Larsen, DOB 02-10-1951, MRN [id number]  PCP:  Delsa Grana, PA-C  Cardiologist:  Kate Sable, MD  Electrophysiologist:  None   Referring MD: Delsa Grana, PA-C   Chief Complaint  Patient presents with  . OTHER    HTN c/o occassional ankle swelling. Meds reviewed verbally with pt.    History of Present Illness:    Jill Larsen is a 69 y.o. female with a hx of hypertension, diabetes who presents due to uncontrolled hypertension.  Patient states having hypertension for years now, but over the past year or so it has become difficult to control.  She denies chest pain or shortness of breath at rest or with exertion.  States having some lower extremity swelling usually towards the end of the day when she has been on her feet or when she sits for too long with her feet hanging.  She otherwise states doing okay.  Takes all her BP meds as prescribed.  Past Medical History:  Diagnosis Date  . Anxiety   . Blind   . Depression   . Diabetes mellitus without complication ([place])   . Hypertension     Past Surgical History:  Procedure Laterality Date  . EYE SURGERY      Current Medications: Current Meds  Medication Sig  . amLODipine (NORVASC) 10 MG tablet Take 1 tablet (10 mg total) by mouth daily.  . blood glucose meter kit and supplies KIT Dispense based on patient and insurance preference. Use up to four times daily for blood sugar monitoring. (FOR Sx IDDM ICD-10-E11.65).  Marland Kitchen brimonidine (ALPHAGAN) 0.2 % ophthalmic solution Place 1 drop into the right eye 2 (two) times daily.  . citalopram (CELEXA) 20 MG tablet Take 1 tablet (20 mg total) by mouth daily.  . cloNIDine (CATAPRES) 0.2 MG tablet Take 0.2 mg by mouth 2 (two) times daily.  . hydrALAZINE (APRESOLINE) 100 MG tablet Take 1 tablet (100 mg total) by mouth 3 (three) times daily.  . hydrOXYzine (ATARAX/VISTARIL) 10 MG tablet Take 1 tablet (10 mg total) by mouth 3 (three)  times daily as needed for anxiety.  . insulin detemir (LEVEMIR) 100 UNIT/ML injection Inject 25 Units into the skin 2 (two) times daily.  . Insulin Detemir (LEVEMIR) 100 UNIT/ML Pen Inject 10-30 Units into the skin 2 (two) times daily.  . Insulin Pen Needle 32G X 4 MM MISC Use as directed with insulin daily  . lisinopril-hydrochlorothiazide (ZESTORETIC) 20-12.5 MG tablet Take 1 tablet by mouth 2 (two) times daily.  . metFORMIN (GLUCOPHAGE) 1000 MG tablet Take 1 tablet (1,000 mg total) by mouth 2 (two) times daily.  . prednisoLONE acetate (PRED FORTE) 1 % ophthalmic suspension Place 1 drop into the right eye 3 (three) times daily.  . [DISCONTINUED] carvedilol (COREG) 12.5 MG tablet Take 12.5 mg by mouth 2 (two) times daily with a meal.  . [DISCONTINUED] lisinopril-hydrochlorothiazide (ZESTORETIC) 20-12.5 MG tablet Take 1 tablet by mouth once daily     Allergies:   Patient has no known allergies.   Social History   Socioeconomic History  . Marital status: Widowed    Spouse name: Not on file  . Number of children: 3  . Years of education: 22  . Highest education level: Some college, no degree  Occupational History  . Not on file  Tobacco Use  . Smoking status: Never Smoker  . Smokeless tobacco: Never Used  Substance and Sexual Activity  .  Alcohol use: Never  . Drug use: Never  . Sexual activity: Not Currently  Other Topics Concern  . Not on file  Social History Narrative  . Not on file   Social Determinants of Health   Financial Resource Strain: Low Risk   . Difficulty of Paying Living Expenses: Not hard at all  Food Insecurity: No Food Insecurity  . Worried About Charity fundraiser in the Last Year: Never true  . Ran Out of Food in the Last Year: Never true  Transportation Needs: No Transportation Needs  . Lack of Transportation (Medical): No  . Lack of Transportation (Non-Medical): No  Physical Activity: Inactive  . Days of Exercise per Week: 0 days  . Minutes of  Exercise per Session: 0 min  Stress: No Stress Concern Present  . Feeling of Stress : Not at all  Social Connections: Moderately Isolated  . Frequency of Communication with Friends and Family: More than three times a week  . Frequency of Social Gatherings with Friends and Family: More than three times a week  . Attends Religious Services: Never  . Active Member of Clubs or Organizations: No  . Attends Archivist Meetings: Never  . Marital Status: Widowed     Family History: The patient's family history includes Cancer in her sister; Diabetes in her son; Diabetes Mellitus II in her brother and brother; Diabetes type II in her mother; Kidney disease in her brother; Prostate cancer in her brother; Stroke in her father and mother.  ROS:   Please see the history of present illness.     All other systems reviewed and are negative.  EKGs/Labs/Other Studies Reviewed:    The following studies were reviewed today:   EKG:  EKG is  ordered today.  The ekg ordered today demonstrates sinus rhythm, normal ECG.  Recent Labs: 02/04/2019: ALT 13; Hemoglobin 10.4; Platelets 319 02/09/2019: BUN 24; Creat 1.28; Potassium 5.3; Sodium 135  Recent Lipid Panel No results found for: CHOL, TRIG, HDL, CHOLHDL, VLDL, LDLCALC, LDLDIRECT  Physical Exam:    VS:  BP (!) 163/83 (BP Location: Right Arm, Patient Position: Sitting, Cuff Size: Large)   Pulse 87   Ht '5\' 3"'$  (1.6 m)   Wt 216 lb 8 oz (98.2 kg)   SpO2 98%   BMI 38.35 kg/m     Wt Readings from Last 3 Encounters:  04/29/19 216 lb 8 oz (98.2 kg)  04/23/19 216 lb (98 kg)  03/17/19 216 lb 4.8 oz (98.1 kg)     GEN:  Well nourished, well developed in no acute distress HEENT: Normal NECK: No JVD; No carotid bruits LYMPHATICS: No lymphadenopathy CARDIAC: RRR, 2/6 systolic murmur right upper sternal border, rubs, gallops RESPIRATORY:  Clear to auscultation without rales, wheezing or rhonchi  ABDOMEN: Soft, non-tender,  non-distended MUSCULOSKELETAL:  No edema; No deformity  SKIN: Warm and dry NEUROLOGIC:  Alert and oriented x 3 PSYCHIATRIC:  Normal affect   ASSESSMENT:    1. Essential hypertension   2. Heart murmur   3. Dependent edema   4. Hypertension, unspecified type    PLAN:    In order of problems listed above:  1. Patient with uncontrolled hypertension.  Will increase lisinopril/HCTZ '20mg'$ -12.'5mg'$  to twice daily dosing.  Increase Coreg to 12.5 mg twice daily to amlodipine 10 mg daily, hydralazine, clonidine as currently prescribed. 2. Systolic murmur noted on exam.  We will get an echocardiogram to evaluate any valve pathology. 3. Patient with history of dependent edema.  Leg raising, compression stocking advised.  No edema noted on my exam today.  This note was generated in part or whole with voice recognition software. Voice recognition is usually quite accurate but there are transcription errors that can and very often do occur. I apologize for any typographical errors that were not detected and corrected.  Medication Adjustments/Labs and Tests Ordered: Current medicines are reviewed at length with the patient today.  Concerns regarding medicines are outlined above.  Orders Placed This Encounter  Procedures  . EKG 12-Lead  . ECHOCARDIOGRAM COMPLETE   Meds ordered this encounter  Medications  . lisinopril-hydrochlorothiazide (ZESTORETIC) 20-12.5 MG tablet    Sig: Take 1 tablet by mouth 2 (two) times daily.    Dispense:  180 tablet    Refill:  1  . carvedilol (COREG) 12.5 MG tablet    Sig: Take 1 tablet (12.5 mg total) by mouth 2 (two) times daily.    Dispense:  180 tablet    Refill:  3    Patient Instructions  Medication Instructions:  1- INCREASE Coreg 12.5 by mouth two times a day. 2- INCREASE Zestoretic to 20/12.5 mg by mouth two times a day  *If you need a refill on your cardiac medications before your next appointment, please call your pharmacy*  Lab Work: none If you  have labs (blood work) drawn today and your tests are completely normal, you will receive your results only by: Marland Kitchen MyChart Message (if you have MyChart) OR . A paper copy in the mail If you have any lab test that is abnormal or we need to change your treatment, we will call you to review the results.  Testing/Procedures: Your physician has requested that you have an echocardiogram. Echocardiography is a painless test that uses sound waves to create images of your heart. It provides your doctor with information about the size and shape of your heart and how well your heart's chambers and valves are working. This procedure takes approximately one hour. There are no restrictions for this procedure. You may get an IV, if needed, to receive an ultrasound enhancing agent through to better visualize your heart.    Follow-Up: At [facility], you and your health needs are our priority.  As part of our continuing mission to provide you with exceptional heart care, we have created designated Provider Care Teams.  These Care Teams include your primary Cardiologist (physician) and Advanced Practice Providers (APPs -  Physician Assistants and Nurse Practitioners) who all work together to provide you with the care you need, when you need it.  Your next appointment:   1 month(s)  The format for your next appointment:   In Person  Provider:   Kate Sable, MD    Echocardiogram An echocardiogram is a procedure that uses painless sound waves (ultrasound) to produce an image of the heart. Images from an echocardiogram can provide important information about:  Signs of coronary artery disease (CAD).  Aneurysm detection. An aneurysm is a weak or damaged part of an artery wall that bulges out from the normal force of blood pumping through the body.  Heart size and shape. Changes in the size or shape of the heart can be associated with certain conditions, including heart failure, aneurysm, and  CAD.  Heart muscle function.  Heart valve function.  Signs of a past heart attack.  Fluid buildup around the heart.  Thickening of the heart muscle.  A tumor or infectious growth around the heart valves. Tell a health care  provider about:  Any allergies you have.  All medicines you are taking, including vitamins, herbs, eye drops, creams, and over-the-counter medicines.  Any blood disorders you have.  Any surgeries you have had.  Any medical conditions you have.  Whether you are pregnant or may be pregnant. What are the risks? Generally, this is a safe procedure. However, problems may occur, including:  Allergic reaction to dye (contrast) that may be used during the procedure. What happens before the procedure? No specific preparation is needed. You may eat and drink normally. What happens during the procedure?   An IV tube may be inserted into one of your veins.  You may receive contrast through this tube. A contrast is an injection that improves the quality of the pictures from your heart.  A gel will be applied to your chest.  A wand-like tool (transducer) will be moved over your chest. The gel will help to transmit the sound waves from the transducer.  The sound waves will harmlessly bounce off of your heart to allow the heart images to be captured in real-time motion. The images will be recorded on a computer. The procedure may vary among health care providers and hospitals. What happens after the procedure?  You may return to your normal, everyday life, including diet, activities, and medicines, unless your health care provider tells you not to do that. Summary  An echocardiogram is a procedure that uses painless sound waves (ultrasound) to produce an image of the heart.  Images from an echocardiogram can provide important information about the size and shape of your heart, heart muscle function, heart valve function, and fluid buildup around your  heart.  You do not need to do anything to prepare before this procedure. You may eat and drink normally.  After the echocardiogram is completed, you may return to your normal, everyday life, unless your health care provider tells you not to do that. This information is not intended to replace advice given to you by your health care provider. Make sure you discuss any questions you have with your health care provider. Document Revised: 06/25/2018 Document Reviewed: 04/06/2016 Elsevier Patient Education  2020 [place], Kate Sable, MD  04/29/2019 9:45 AM    [place]

## 2019-05-01 LAB — CBC WITH DIFFERENTIAL/PLATELET
Absolute Monocytes: 520 cells/uL (ref 200–950)
Basophils Absolute: 59 cells/uL (ref 0–200)
Basophils Relative: 0.9 %
Eosinophils Absolute: 150 cells/uL (ref 15–500)
Eosinophils Relative: 2.3 %
HCT: 31.3 % — ABNORMAL LOW (ref 35.0–45.0)
Hemoglobin: 10.4 g/dL — ABNORMAL LOW (ref 11.7–15.5)
Lymphs Abs: 1794 cells/uL (ref 850–3900)
MCH: 27.8 pg (ref 27.0–33.0)
MCHC: 33.2 g/dL (ref 32.0–36.0)
MCV: 83.7 fL (ref 80.0–100.0)
MPV: 11.3 fL (ref 7.5–12.5)
Monocytes Relative: 8 %
Neutro Abs: 3978 cells/uL (ref 1500–7800)
Neutrophils Relative %: 61.2 %
Platelets: 289 10*3/uL (ref 140–400)
RBC: 3.74 10*6/uL — ABNORMAL LOW (ref 3.80–5.10)
RDW: 12.8 % (ref 11.0–15.0)
Total Lymphocyte: 27.6 %
WBC: 6.5 10*3/uL (ref 3.8–10.8)

## 2019-05-01 LAB — COMPLETE METABOLIC PANEL WITH GFR
AG Ratio: 1.3 (calc) (ref 1.0–2.5)
ALT: 16 U/L (ref 6–29)
AST: 14 U/L (ref 10–35)
Albumin: 4 g/dL (ref 3.6–5.1)
Alkaline phosphatase (APISO): 59 U/L (ref 37–153)
BUN/Creatinine Ratio: 20 (calc) (ref 6–22)
BUN: 28 mg/dL — ABNORMAL HIGH (ref 7–25)
CO2: 25 mmol/L (ref 20–32)
Calcium: 9.4 mg/dL (ref 8.6–10.4)
Chloride: 105 mmol/L (ref 98–110)
Creat: 1.41 mg/dL — ABNORMAL HIGH (ref 0.50–0.99)
GFR, Est African American: 45 mL/min/{1.73_m2} — ABNORMAL LOW (ref >=60)
GFR, Est Non African American: 39 mL/min/{1.73_m2} — ABNORMAL LOW (ref >=60)
Globulin: 3 g/dL (calc) (ref 1.9–3.7)
Glucose, Bld: 130 mg/dL — ABNORMAL HIGH (ref 65–99)
Potassium: 5 mmol/L (ref 3.5–5.3)
Sodium: 140 mmol/L (ref 135–146)
Total Bilirubin: 0.4 mg/dL (ref 0.2–1.2)
Total Protein: 7 g/dL (ref 6.1–8.1)

## 2019-05-01 LAB — HEMOGLOBIN A1C
Hgb A1c MFr Bld: 7.8 % of total Hgb — ABNORMAL HIGH (ref <5.7)
Mean Plasma Glucose: 177 (calc)
eAG (mmol/L): 9.8 (calc)

## 2019-05-01 LAB — LIPID PANEL
Cholesterol: 179 mg/dL (ref <200)
HDL: 57 mg/dL (ref >=50)
LDL Cholesterol (Calc): 104 mg/dL (calc) — ABNORMAL HIGH
Non-HDL Cholesterol (Calc): 122 mg/dL (calc) (ref <130)
Total CHOL/HDL Ratio: 3.1 (calc) (ref <5.0)
Triglycerides: 90 mg/dL (ref <150)

## 2019-05-09 ENCOUNTER — Other Ambulatory Visit: Payer: Self-pay | Admitting: Family Medicine

## 2019-05-09 DIAGNOSIS — I10 Essential (primary) hypertension: Secondary | ICD-10-CM

## 2019-05-09 DIAGNOSIS — I161 Hypertensive emergency: Secondary | ICD-10-CM

## 2019-05-25 ENCOUNTER — Other Ambulatory Visit: Payer: Self-pay | Admitting: Cardiology

## 2019-05-25 DIAGNOSIS — R011 Cardiac murmur, unspecified: Secondary | ICD-10-CM

## 2019-05-31 ENCOUNTER — Ambulatory Visit (INDEPENDENT_AMBULATORY_CARE_PROVIDER_SITE_OTHER): Payer: Medicare Other | Admitting: Family Medicine

## 2019-05-31 ENCOUNTER — Encounter: Payer: Self-pay | Admitting: Family Medicine

## 2019-05-31 VITALS — BP 151/72 | HR 77 | Ht 62.0 in | Wt 216.0 lb

## 2019-05-31 DIAGNOSIS — E1139 Type 2 diabetes mellitus with other diabetic ophthalmic complication: Secondary | ICD-10-CM | POA: Diagnosis not present

## 2019-05-31 DIAGNOSIS — E1165 Type 2 diabetes mellitus with hyperglycemia: Secondary | ICD-10-CM | POA: Diagnosis not present

## 2019-05-31 DIAGNOSIS — E162 Hypoglycemia, unspecified: Secondary | ICD-10-CM

## 2019-05-31 DIAGNOSIS — Z794 Long term (current) use of insulin: Secondary | ICD-10-CM | POA: Diagnosis not present

## 2019-05-31 NOTE — Patient Instructions (Signed)
[email or web address] 

## 2019-05-31 NOTE — Progress Notes (Signed)
Name: Jill Larsen   MRN: [id number]    DOB: 07-10-1950   Date:05/31/2019       Progress Note  Subjective:    I connected with  Jill Larsen  on 05/31/19 at  3:20 PM EDT by a video enabled telemedicine application and verified that I am speaking with the correct person using two identifiers.  I discussed the limitations of evaluation and management by telemedicine and the availability of in person appointments. The patient expressed understanding and agreed to proceed. Staff also discussed with the patient that there may be a patient responsible charge related to this service. Patient Location: home Provider Location: [facility] Additional Individuals present: her daughter in law  Chief Complaint  Patient presents with  . Follow-up  . Diabetes    checking in [id number] This am was 35    Jill Larsen is a 69 y.o. female, presents for virtual visit for routine follow up on the conditions listed above.  Pt presents for f/up on uncontrolled IDDM, after recent insulin dose changes.  Since establishing care here about 4 months ago she is giving herself Levemir 25 units twice a day without any glucose monitoring, we had given her multiple prescriptions and even called in a glucometer multiple times for her and she did not actually get them glucose meter until about 1 month ago.  When she started monitoring she found that she was having hypoglycemic episodes in the morning, last visit we decreased her insulin dose at bedtime to 20 units and she was to continue monitoring her morning fasting blood sugars.  She was also referred to cardiology for assistance with labile hypertension frequently having hypertensive urgency, recently hospitalized for the same in November prior to presenting here as a new patient.  Patient's last A1c was improved but not yet controlled: Lab Results  Component Value Date   HGBA1C 7.8 (H) 04/30/2019  She reports that she is having no hypoglycemic episodes following the dose  reduction of nighttime insulin.  The lowest reading she has had was this morning blood sugar was 78 and she felt fine.  Usually her fasting blood sugars are 100-110, patient states CBG 103-105   Pt saw cardiology for consult - stated she had irregular HR, some of her medications were adjusted and she has f/up for some more testing in the next couple weeks.     Patient Active Problem List   Diagnosis Date Noted  . Uncontrolled type 2 diabetes mellitus with hyperglycemia ([place]) 04/28/2019  . Diabetes mellitus ([place]) 04/28/2019  . Labile hypertension 04/28/2019  . Ulcer of right foot ([place]) 04/28/2019  . AKI (acute kidney injury) ([place]) 01/20/2019  . Diabetes mellitus without complication ([place])   . Hypertension   . Hypertensive emergency   . Hyperkalemia   . Diabetic macular edema ([place]) 05/21/2018  . 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 Vitreous hemorrhage of right eye ([place]) 02/14/2017    Current Outpatient Medications:  .  amLODipine (NORVASC) 10 MG tablet, Take 1 tablet (10 mg total) by mouth daily., Disp: 90 tablet, Rfl: 1 .  brimonidine (ALPHAGAN) 0.2 % ophthalmic solution, Place 1 drop into the right eye 2 (two) times daily., Disp: , Rfl:  .  carvedilol (COREG) 12.5 MG tablet, Take 1 tablet (12.5 mg total) by mouth 2 (two) times daily., Disp: 180 tablet, Rfl: 3 .  citalopram (CELEXA) 20 MG  tablet, Take 1 tablet (20 mg total) by mouth daily., Disp: 90 tablet, Rfl: 3 .  cloNIDine (CATAPRES) 0.2 MG tablet, Take 0.2 mg by mouth 2 (two) times daily., Disp: , Rfl:  .  hydrALAZINE (APRESOLINE) 100 MG tablet, TAKE 1 TABLET BY MOUTH THREE TIMES DAILY, Disp: 270 tablet, Rfl: 3 .  hydrOXYzine (ATARAX/VISTARIL) 10 MG tablet, Take 1 tablet (10 mg total) by mouth 3 (three) times daily as needed for anxiety., Disp: 90 tablet, Rfl: 1 .   lisinopril-hydrochlorothiazide (ZESTORETIC) 20-12.5 MG tablet, Take 1 tablet by mouth 2 (two) times daily., Disp: 180 tablet, Rfl: 1 .  metFORMIN (GLUCOPHAGE) 1000 MG tablet, Take 1 tablet (1,000 mg total) by mouth 2 (two) times daily., Disp: 180 tablet, Rfl: 3 .  prednisoLONE acetate (PRED FORTE) 1 % ophthalmic suspension, Place 1 drop into the right eye 3 (three) times daily., Disp: , Rfl:  .  blood glucose meter kit and supplies KIT, Dispense based on patient and insurance preference. Use up to four times daily for blood sugar monitoring. (FOR Sx IDDM ICD-10-E11.65)., Disp: 1 each, Rfl: 0 .  Insulin Detemir (LEVEMIR) 100 UNIT/ML Pen, Inject 10-25 Units into the skin 2 (two) times daily. Per instruction from PCP/PA, Disp: 15 mL, Rfl: 6 .  Insulin Pen Needle 32G X 4 MM MISC, Use as directed with insulin daily, Disp: 100 each, Rfl: 3 No Known Allergies  Past Surgical History:  Procedure Laterality Date  . EYE SURGERY     Family History  Problem Relation Age of Onset  . Stroke Mother   . Diabetes type II Mother   . Stroke Father   . Diabetes Mellitus II Brother   . Cancer Sister   . Diabetes Son   . Diabetes Mellitus II Brother   . Kidney disease Brother   . Prostate cancer Brother    Social History   Socioeconomic History  . Marital status: Widowed    Spouse name: Not on file  . Number of children: 3  . Years of education: 3  . Highest education level: Some college, no degree  Occupational History  . Not on file  Tobacco Use  . Smoking status: Never Smoker  . Smokeless tobacco: Never Used  Substance and Sexual Activity  . Alcohol use: Never  . Drug use: Never  . Sexual activity: Not Currently  Other Topics Concern  . Not on file  Social History Narrative  . Not on file   Social Determinants of Health   Financial Resource Strain: Low Risk   . Difficulty of Paying Living Expenses: Not hard at all  Food Insecurity: No Food Insecurity  . Worried About Sales executive in the Last Year: Never true  . Ran Out of Food in the Last Year: Never true  Transportation Needs: No Transportation Needs  . Lack of Transportation (Medical): No  . Lack of Transportation (Non-Medical): No  Physical Activity: Inactive  . Days of Exercise per Week: 0 days  . Minutes of Exercise per Session: 0 min  Stress: No Stress Concern Present  . Feeling of Stress : Not at all  Social Connections: Moderately Isolated  . Frequency of Communication with Friends and Family: More than three times a week  . Frequency of Social Gatherings with Friends and Family: More than three times a week  . Attends Religious Services: Never  . Active Member of Clubs or Organizations: No  . Attends Archivist Meetings: Never  . Marital Status: Widowed  Intimate Partner Violence: Not At Risk  . Fear of Current or Ex-Partner: No  . Emotionally Abused: No  . Physically Abused: No  . Sexually Abused: No    Chart Review Today: I personally reviewed active problem list, medication list, allergies, family history, social history, health maintenance, notes from last encounter, lab results, imaging with the patient/caregiver today.   Review of Systems  10 Systems reviewed and are negative for acute change except as noted in the HPI.   Objective:    Virtual encounter, vitals limited, only able to obtain the following Today's Vitals   05/31/19 1522  BP: (!) 151/72  Pulse: 77  Weight: 216 lb (98 kg)  Height: _0  (1.575 m)   Body mass index is 39.51 kg/m. Nursing Note and Vital Signs reviewed.  Physical Exam Well-appearing elderly female, appears stated age, alert, answering questions appropriately PE limited by telephone encounter  No results found for this or any previous visit (from the past 104 hour(s)).  PHQ2/9: Depression screen [facility] 2/9 05/31/2019 04/23/2019 04/09/2019 03/17/2019 02/18/2019  Decreased Interest 0 0 0 0 0  Down, Depressed, Hopeless 0 0 0 0 0  PHQ - 2 Score  0 0 0 0 0  Altered sleeping 0 0 - 0 0  Tired, decreased energy 0 0 - 0 0  Change in appetite 0 0 - 0 0  Feeling bad or failure about yourself  0 0 - 0 0  Trouble concentrating 0 0 - 0 0  Moving slowly or fidgety/restless 0 0 - 0 0  Suicidal thoughts 0 0 - 0 0  PHQ-9 Score 0 0 - 0 0  Difficult doing work/chores Not difficult at all Not difficult at all - Not difficult at all Not difficult at all   PHQ-2/9 Result is negative, reviewed today  Fall Risk: Fall Risk  05/31/2019 04/23/2019 04/09/2019 03/17/2019 02/18/2019  Falls in the past year? 0 0 1 1 0  Number falls in past yr: 0 0 1 1 0  Injury with Fall? 0 0 0 0 0  Risk for fall due to : - - History of fall(s);Impaired vision - -  Follow up - - Falls prevention discussed - -     Assessment and Plan:   1. Uncontrolled type 2 diabetes mellitus with hyperglycemia ([place]) Patient will be due for an office follow-up with lab work in about 2 months to recheck her A1c previously has been uncontrolled last was 7.8 Seem to be currently doing well with Metformin 1000 mg twice daily and Levemir 25 units in the morning and 20 units at bedtime Fasting blood sugars have been in desired range usually 100-110  2. Type 2 diabetes mellitus with other ophthalmic complication, with long-term current use of insulin ([place]) See above  3. Hypoglycemia Resolved after adjustment of basal insulin decreased nighttime dose of Levemir from 25 units to 20 units no longer having any hypoglycemic episodes  I discussed the assessment and treatment plan with the patient. The patient was provided an opportunity to ask questions and all were answered. The patient agreed with the plan and demonstrated an understanding of the instructions.  The patient was advised to call back or seek an in-person evaluation if the symptoms worsen or if the condition fails to improve as anticipated.  I provided 20 minutes of non-face-to-face time during this encounter.  Delsa Grana,  PA-C 05/31/19 3:49 PM

## 2019-06-02 ENCOUNTER — Other Ambulatory Visit: Payer: Self-pay

## 2019-06-02 ENCOUNTER — Ambulatory Visit (INDEPENDENT_AMBULATORY_CARE_PROVIDER_SITE_OTHER): Payer: Medicare Other

## 2019-06-02 DIAGNOSIS — R011 Cardiac murmur, unspecified: Secondary | ICD-10-CM

## 2019-06-02 MED ORDER — PERFLUTREN LIPID MICROSPHERE
1.0000 mL | INTRAVENOUS | Status: AC | PRN
  Administered 2019-06-02: 2 mL via INTRAVENOUS

## 2019-06-04 ENCOUNTER — Ambulatory Visit: Payer: Medicare Other | Admitting: Cardiology

## 2019-06-04 NOTE — Progress Notes (Signed)
Office Visit    Patient Name: Jill Larsen Date of Encounter: 06/11/2019  Primary Care Provider:  Delsa Grana, PA-C Primary Cardiologist:  Kate Sable, MD  Chief Complaint    Chief Complaint  Patient presents with  . OTHER    1 month f/u echo no complaints today. Meds reviewed verbally with pt.   69 year old female with a history of hypertension and diabetes, and seen today for follow-up of HTN.  Past Medical History    Past Medical History:  Diagnosis Date  . Anxiety   . Blind   . Depression   . Diabetes mellitus without complication ([place])   . Hypertension    Past Surgical History:  Procedure Laterality Date  . EYE SURGERY      Allergies  No Known Allergies  History of Present Illness    Jill Larsen is a 69 y.o. female with PMH as above. She established with heart care 04/29/2019 for poorly controlled blood pressure. She also reported some lower extremity edema towards the end of the day. Her lisinopril/HCTZ 20 mg - 12.5 mg was increased to twice daily dosing. Coreg was increased to 12.5 mg twice daily. Amlodipine, hydralazine, and clonidine were continued as prescribed. An echo was obtained for systolic murmur noted on exam with no significant findings. Compression stockings and elevation were recommended for her lower extremity edema. Today, she presents to clinic and states she is doing well from a cardiac perspective. She denies chest pain, palpitations, dyspnea, pnd, orthopnea, n, v, dizziness, syncope, weight gain, or early satiety. Her BP is still elevated with patient reporting that she is taking her medications. We reviewed her diet together to ensure low salt and fluid intake. She reports drinking 4 sodas per day with recommendation to try and replace at least 1 per day with sparking water and a splash of sugar free juice to keep the sugar amount low (given her DM). She states that she has not been very active given her eyesight but will try the resistance  bands recommended with her daughter stating today that she owns some that her mother can borrow. She denies any s/sx of bleeding. We reviewed the recommendation for compression stockings and leg elevation.   Home Medications    Prior to Admission medications   Medication Sig Start Date End Date Taking? Authorizing Provider  amLODipine (NORVASC) 10 MG tablet Take 1 tablet (10 mg total) by mouth daily. 01/23/19   Ivor Costa, MD  blood glucose meter kit and supplies KIT Dispense based on patient and insurance preference. Use up to four times daily for blood sugar monitoring. (FOR Sx IDDM ICD-10-E11.65). 03/17/19   Delsa Grana, PA-C  brimonidine (ALPHAGAN) 0.2 % ophthalmic solution Place 1 drop into the right eye 2 (two) times daily. 01/14/19   [provider]  carvedilol (COREG) 12.5 MG tablet Take 1 tablet (12.5 mg total) by mouth 2 (two) times daily. 04/29/19 07/28/19  Kate Sable, MD  citalopram (CELEXA) 20 MG tablet Take 1 tablet (20 mg total) by mouth daily. 02/04/19   Delsa Grana, PA-C  cloNIDine (CATAPRES) 0.2 MG tablet Take 0.2 mg by mouth 2 (two) times daily. 04/22/18   [provider]  hydrALAZINE (APRESOLINE) 100 MG tablet TAKE 1 TABLET BY MOUTH THREE TIMES DAILY 05/10/19   Delsa Grana, PA-C  hydrOXYzine (ATARAX/VISTARIL) 10 MG tablet Take 1 tablet (10 mg total) by mouth 3 (three) times daily as needed for anxiety. 02/04/19   Delsa Grana, PA-C  Insulin Detemir (LEVEMIR) 100  UNIT/ML Pen Inject 10-25 Units into the skin 2 (two) times daily. Per instruction from PCP/PA 04/29/19   Delsa Grana, PA-C  Insulin Pen Needle 32G X 4 MM MISC Use as directed with insulin daily 04/23/19   Delsa Grana, PA-C  lisinopril-hydrochlorothiazide (ZESTORETIC) 20-12.5 MG tablet Take 1 tablet by mouth 2 (two) times daily. 04/29/19   Kate Sable, MD  metFORMIN (GLUCOPHAGE) 1000 MG tablet Take 1 tablet (1,000 mg total) by mouth 2 (two) times daily. 02/04/19   Delsa Grana, PA-C    prednisoLONE acetate (PRED FORTE) 1 % ophthalmic suspension Place 1 drop into the right eye 3 (three) times daily. 07/30/18   [provider]    Review of Systems    She denies chest pain, palpitations, dyspnea, pnd, orthopnea, n, v, dizziness, syncope, edema, weight gain, or early satiety.    All other systems reviewed and are otherwise negative except as noted above.  Physical Exam    VS:  BP (!) 152/80 (BP Location: Left Arm, Patient Position: Sitting, Cuff Size: Large)   Pulse 74   Ht '5\' 3"'$  (1.6 m)   Wt 214 lb (97.1 kg)   SpO2 97%   BMI 37.91 kg/m  , BMI Body mass index is 37.91 kg/m. GEN: Well nourished, well developed, in no acute distress. HEENT: normal. Neck: Supple, no JVD, carotid bruits, or masses. Cardiac: RRR, no murmurs, rubs, or gallops. No clubbing, cyanosis, edema.  Radials/DP/PT 2+ and equal bilaterally.  Respiratory:  Respirations regular and unlabored, clear to auscultation bilaterally. GI: Soft, nontender, nondistended, BS + x 4. MS: no deformity or atrophy. Skin: warm and dry, no rash. Neuro:  Strength and sensation are intact. Psych: Normal affect.  Accessory Clinical Findings    ECG personally reviewed by me today - NSR, 74bpm, poor R wave progression in inferior leads, baseline wander and artifact- no acute changes.  VITALS Reviewed today   Temp Readings from Last 3 Encounters:  03/17/19 97.9 F (36.6 C)  02/04/19 97.9 F (36.6 C)  01/23/19 98.7 F (37.1 C) (Oral)   BP Readings from Last 3 Encounters:  06/08/19 (!) 152/80  05/31/19 (!) 151/72  04/29/19 (!) 163/83   Pulse Readings from Last 3 Encounters:  06/08/19 74  05/31/19 77  04/29/19 87    Wt Readings from Last 3 Encounters:  06/08/19 214 lb (97.1 kg)  05/31/19 216 lb (98 kg)  04/29/19 216 lb 8 oz (98.2 kg)     LABS  reviewed today   [place] present? Yes/No: Yes  Lab Results  Component Value Date   WBC 6.5 04/30/2019   HGB 10.4 (L) 04/30/2019   HCT  31.3 (L) 04/30/2019   MCV 83.7 04/30/2019   PLT 289 04/30/2019   Lab Results  Component Value Date   CREATININE 1.17 (H) 06/08/2019   BUN 20 06/08/2019   NA 138 06/08/2019   K 4.7 06/08/2019   CL 103 06/08/2019   CO2 25 06/08/2019   Lab Results  Component Value Date   ALT 16 04/30/2019   AST 14 04/30/2019   ALKPHOS 63 01/20/2019   BILITOT 0.4 04/30/2019   Lab Results  Component Value Date   CHOL 179 04/30/2019   HDL 57 04/30/2019   LDLCALC 104 (H) 04/30/2019   TRIG 90 04/30/2019   CHOLHDL 3.1 04/30/2019    Lab Results  Component Value Date   HGBA1C 7.8 (H) 04/30/2019   No results found for: TSH   STUDIES/PROCEDURES reviewed today   Echo 06/02/19  1. Left ventricular ejection fraction, by estimation, is 60 to 65%. The  left ventricle has normal function. The left ventricle has no regional  wall motion abnormalities. There is mild left ventricular hypertrophy.  Left ventricular diastolic parameters  are indeterminate.  2. Right ventricular systolic function is normal. The right ventricular  size is normal. There is normal pulmonary artery systolic pressure.  3. The mitral valve is normal in structure. No evidence of mitral valve  regurgitation. No evidence of mitral stenosis.  4. The aortic valve is normal in structure. Aortic valve regurgitation is  not visualized. No aortic stenosis is present.  5. The inferior vena cava is normal in size with greater than 50%  respiratory variability, suggesting right atrial pressure of 3 mmHg.   Assessment & Plan    Resistant Hypertension  --Suboptimal control. She remains on amlodipine '10mg'$  daily, Coreg 12.'5mg'$  BID, Clonidine 0.'2mg'$  BID, hydralazine, and lisinopril - HCTZ. We will get a BMET and then titrate medications for more optimal control with tentative plan to separate lisinopril - HCTZ, given this will allow for further up-titration of lisinopril and as we may have better success with chlorthalidone in place of HCTZ.  Ideally, we would discontinue clonidine, given it is often associated with rebound hypertension. We could consider spironolactone going forward. Given her HR, would defer from uptitration of Coreg. Consider renal artery scan, given her multiple medications. Low salt diet and decreased fluid intake discussed. We reviewed these recommendations today with patient understanding. We also reviewed the echo above, given the suspected murmur.   Dependent Edema --We discussed leg elevation, compression stockings. No significant edema noted on today's exam.  Echo Study Review --Systolic murmur noted on 5/79 exam. We reviewed the echo findings. No further workup at this time.   Medication changes: Pending labs, recommend chlorthalidone over HCTZ. We may also need KCl. Labs ordered: BMET Studies / Imaging ordered: None. At RTC, consider renal artery scan. Future considerations: Consider renal artery scan Disposition: RTC 1 month  Total time spent with patient today 45 minutes. This includes reviewing records, evaluating the patient, and coordinating care. Face-to-face time >50%.    Arvil Chaco, PA-C

## 2019-06-08 ENCOUNTER — Other Ambulatory Visit
Admission: RE | Admit: 2019-06-08 | Discharge: 2019-06-08 | Disposition: A | Discharge status: Home / Self Care | Payer: Medicare Other | Source: Ambulatory Visit | Attending: Physician Assistant | Admitting: Physician Assistant

## 2019-06-08 ENCOUNTER — Other Ambulatory Visit: Payer: Self-pay

## 2019-06-08 ENCOUNTER — Encounter: Payer: Self-pay | Admitting: Physician Assistant

## 2019-06-08 ENCOUNTER — Ambulatory Visit (INDEPENDENT_AMBULATORY_CARE_PROVIDER_SITE_OTHER): Payer: Medicare Other | Admitting: Physician Assistant

## 2019-06-08 VITALS — BP 152/80 | HR 74 | Ht 63.0 in | Wt 214.0 lb

## 2019-06-08 DIAGNOSIS — I10 Essential (primary) hypertension: Secondary | ICD-10-CM

## 2019-06-08 DIAGNOSIS — Z794 Long term (current) use of insulin: Secondary | ICD-10-CM

## 2019-06-08 DIAGNOSIS — E1142 Type 2 diabetes mellitus with diabetic polyneuropathy: Secondary | ICD-10-CM

## 2019-06-08 DIAGNOSIS — R609 Edema, unspecified: Secondary | ICD-10-CM

## 2019-06-08 LAB — BASIC METABOLIC PANEL
Anion gap: 10 (ref 5–15)
BUN: 20 mg/dL (ref 8–23)
CO2: 25 mmol/L (ref 22–32)
Calcium: 9.6 mg/dL (ref 8.9–10.3)
Chloride: 103 mmol/L (ref 98–111)
Creatinine, Ser: 1.17 mg/dL — ABNORMAL HIGH (ref 0.44–1.00)
GFR calc Af Amer: 56 mL/min — ABNORMAL LOW (ref >60)
GFR calc non Af Amer: 49 mL/min — ABNORMAL LOW (ref >60)
Glucose, Bld: 174 mg/dL — ABNORMAL HIGH (ref 70–99)
Potassium: 4.7 mmol/L (ref 3.5–5.1)
Sodium: 138 mmol/L (ref 135–145)

## 2019-06-08 NOTE — Patient Instructions (Signed)
Medication Instructions:  No changes *If you need a refill on your cardiac medications before your next appointment, please call your pharmacy*   Lab Work: Your physician recommends that you have lab work today at the medical mall (BMET) If you have labs (blood work) drawn today and your tests are completely normal, you will receive your results only by: Marland Kitchen MyChart Message (if you have MyChart) OR . A paper copy in the mail If you have any lab test that is abnormal or we need to change your treatment, we will call you to review the results.   Testing/Procedures: None ordered    Follow-Up: At [facility], you and your health needs are our priority.  As part of our continuing mission to provide you with exceptional heart care, we have created designated Provider Care Teams.  These Care Teams include your primary Cardiologist (physician) and Advanced Practice Providers (APPs -  Physician Assistants and Nurse Practitioners) who all work together to provide you with the care you need, when you need it.  We recommend signing up for the patient portal called "MyChart".  Sign up information is provided on this After Visit Summary.  MyChart is used to connect with patients for Virtual Visits (Telemedicine).  Patients are able to view lab/test results, encounter notes, upcoming appointments, etc.  Non-urgent messages can be sent to your provider as well.   To learn more about what you can do with MyChart, go to [email or web address].    Your next appointment:   1 month(s)  The format for your next appointment:   In Person  Provider:    You may see Debbe Odea, MD or Marisue Ivan, PA-C.   Other Instructions 1- Replace sodas with seltzer water and a splash of fruit juice.   2-  How to use a home blood pressure monitor. . Be still. Don't smoke, drink caffeinated beverages or exercise within 30 minutes before measuring your blood pressure. . Sit correctly. Sit with your back  straight and supported (on a dining chair, rather than a sofa). Your feet should be flat on the floor and your legs should not be crossed. Your arm should be supported on a flat surface (such as a table) with the upper arm at heart level. Make sure the bottom of the cuff is placed directly above the bend of the elbow.  . Measure at the same time every day. It's important to take the readings at the same time each day, such as morning and evening. Take reading approximately 1 hour after BP medications.

## 2019-06-14 ENCOUNTER — Telehealth: Payer: Self-pay

## 2019-06-14 DIAGNOSIS — R0989 Other specified symptoms and signs involving the circulatory and respiratory systems: Secondary | ICD-10-CM

## 2019-06-14 MED ORDER — LOSARTAN POTASSIUM 50 MG PO TABS: 50.0000 mg | ORAL_TABLET | Freq: Every day | ORAL | 3 refills | Status: DC

## 2019-06-14 MED ORDER — CHLORTHALIDONE 25 MG PO TABS: 25.0000 mg | ORAL_TABLET | Freq: Every day | ORAL | 3 refills | Status: DC

## 2019-06-14 NOTE — Telephone Encounter (Signed)
-----   Message from Lennon Alstrom, PA-C sent at 06/11/2019  5:25 PM EDT ----- Please let Jill Larsen know that her renal function is stable. Let's stop her lisinopril-HCTZ. We will start losartan 50mg  daily with chlorthalidone 25mg  daily and have her check her pressures twice daily for a week. Call the office if dizzy or for consistently elevated pressures. We will recheck a BMET in 1 week.

## 2019-06-14 NOTE — Telephone Encounter (Signed)
Call to patient to discuss POC and results.   Spoke with daughter Charolett Bumpers.  She verbalized understanding and is agreeable to POC.   Rx updated and orders placed.   Advised pt to call for any further questions or concerns.

## 2019-07-09 ENCOUNTER — Ambulatory Visit: Payer: Medicare Other | Admitting: Cardiology

## 2019-07-12 ENCOUNTER — Encounter: Payer: Self-pay | Admitting: Cardiology

## 2019-07-12 ENCOUNTER — Ambulatory Visit (INDEPENDENT_AMBULATORY_CARE_PROVIDER_SITE_OTHER): Payer: Medicare Other | Admitting: Cardiology

## 2019-07-12 ENCOUNTER — Other Ambulatory Visit: Payer: Self-pay

## 2019-07-12 VITALS — BP 176/80 | HR 82 | Ht 63.0 in | Wt 210.0 lb

## 2019-07-12 DIAGNOSIS — I10 Essential (primary) hypertension: Secondary | ICD-10-CM

## 2019-07-12 DIAGNOSIS — R011 Cardiac murmur, unspecified: Secondary | ICD-10-CM

## 2019-07-12 MED ORDER — AMLODIPINE BESYLATE 10 MG PO TABS: 10.0000 mg | ORAL_TABLET | Freq: Every day | ORAL | 1 refills | Status: DC

## 2019-07-12 MED ORDER — LOSARTAN POTASSIUM 100 MG PO TABS: 100.0000 mg | ORAL_TABLET | Freq: Every day | ORAL | 1 refills | Status: DC

## 2019-07-12 NOTE — Patient Instructions (Signed)
Medication Instructions:  Your physician has recommended you make the following change in your medication:   INCREASE Losartan to 100 mg daily. An Rx has been sent to your pharmacy.  *If you need a refill on your cardiac medications before your next appointment, please call your pharmacy*   Lab Work: None ordered If you have labs (blood work) drawn today and your tests are completely normal, you will receive your results only by: Marland Kitchen MyChart Message (if you have MyChart) OR . A paper copy in the mail If you have any lab test that is abnormal or we need to change your treatment, we will call you to review the results.   Testing/Procedures: None ordered   Follow-Up: At [facility], you and your health needs are our priority.  As part of our continuing mission to provide you with exceptional heart care, we have created designated Provider Care Teams.  These Care Teams include your primary Cardiologist (physician) and Advanced Practice Providers (APPs -  Physician Assistants and Nurse Practitioners) who all work together to provide you with the care you need, when you need it.  We recommend signing up for the patient portal called "MyChart".  Sign up information is provided on this After Visit Summary.  MyChart is used to connect with patients for Virtual Visits (Telemedicine).  Patients are able to view lab/test results, encounter notes, upcoming appointments, etc.  Non-urgent messages can be sent to your provider as well.   To learn more about what you can do with MyChart, go to [email or web address].    Your next appointment:   3 month(s)  The format for your next appointment:   In Person  Provider:    You may see Debbe Odea, MD or one of the following Advanced Practice Providers on your designated Care Team:    Nicolasa Ducking, NP  Eula Listen, PA-C  Marisue Ivan, PA-C    Other Instructions N/A

## 2019-07-12 NOTE — Progress Notes (Signed)
Cardiology Office Note:    Date:  07/12/2019   ID:  Jill Larsen, DOB 12-Apr-1950, MRN [id number]  PCP:  Delsa Grana, PA-C  Cardiologist:  Kate Sable, MD  Electrophysiologist:  None   Referring MD: Delsa Grana, PA-C   Chief Complaint  Patient presents with  . Other    Follow up post ECHO. Meds reviewed vebrally with patient.     History of Present Illness:    Jill Larsen is a 69 y.o. female with a hx of hypertension, diabetes who presents for follow-up.  She was last seen due to uncontrolled hypertension.  Patient states having hypertension for years now, but over the past year or so it has become difficult to control.  Her BP meds were titrated after last visit.  She also had a systolic murmur on physical exam.  Echocardiogram was ordered.  She now presents for results and follow-up of blood pressure.  She states doing okay, denies any complaints or concerns at this time.  She does not check her blood pressure frequently.  She is planning on moving to [place] with daughter who is in the [employer] and helps her with taking her to appointments and also medication management.  Past Medical History:  Diagnosis Date  . Anxiety   . Blind   . Depression   . Diabetes mellitus without complication ([place])   . Hypertension     Past Surgical History:  Procedure Laterality Date  . EYE SURGERY      Current Medications: Current Meds  Medication Sig  . amLODipine (NORVASC) 10 MG tablet Take 1 tablet (10 mg total) by mouth daily.  . blood glucose meter kit and supplies KIT Dispense based on patient and insurance preference. Use up to four times daily for blood sugar monitoring. (FOR Sx IDDM ICD-10-E11.65).  Marland Kitchen brimonidine (ALPHAGAN) 0.2 % ophthalmic solution Place 1 drop into the right eye 2 (two) times daily.  . carvedilol (COREG) 12.5 MG tablet Take 1 tablet (12.5 mg total) by mouth 2 (two) times daily.  . chlorthalidone (HYGROTON) 25 MG tablet Take 1 tablet (25 mg total) by mouth daily.    . citalopram (CELEXA) 20 MG tablet Take 1 tablet (20 mg total) by mouth daily.  . cloNIDine (CATAPRES) 0.2 MG tablet Take 0.2 mg by mouth 2 (two) times daily.  . hydrALAZINE (APRESOLINE) 100 MG tablet TAKE 1 TABLET BY MOUTH THREE TIMES DAILY  . hydrOXYzine (ATARAX/VISTARIL) 10 MG tablet Take 1 tablet (10 mg total) by mouth 3 (three) times daily as needed for anxiety.  . Insulin Detemir (LEVEMIR) 100 UNIT/ML Pen Inject 10-25 Units into the skin 2 (two) times daily. Per instruction from PCP/PA  . Insulin Pen Needle 32G X 4 MM MISC Use as directed with insulin daily  . losartan (COZAAR) 100 MG tablet Take 1 tablet (100 mg total) by mouth daily.  . metFORMIN (GLUCOPHAGE) 1000 MG tablet Take 1 tablet (1,000 mg total) by mouth 2 (two) times daily.  . prednisoLONE acetate (PRED FORTE) 1 % ophthalmic suspension Place 1 drop into the right eye 3 (three) times daily.  . [DISCONTINUED] amLODipine (NORVASC) 10 MG tablet Take 1 tablet (10 mg total) by mouth daily.  . [DISCONTINUED] losartan (COZAAR) 50 MG tablet Take 1 tablet (50 mg total) by mouth daily.     Allergies:   Patient has no known allergies.   Social History   Socioeconomic History  . Marital status: Widowed    Spouse name: Not on file  . Number of  children: 3  . Years of education: 64  . Highest education level: Some college, no degree  Occupational History  . Not on file  Tobacco Use  . Smoking status: Never Smoker  . Smokeless tobacco: Never Used  Substance and Sexual Activity  . Alcohol use: Never  . Drug use: Never  . Sexual activity: Not Currently  Other Topics Concern  . Not on file  Social History Narrative  . Not on file   Social Determinants of Health   Financial Resource Strain: Low Risk   . Difficulty of Paying Living Expenses: Not hard at all  Food Insecurity: No Food Insecurity  . Worried About Charity fundraiser in the Last Year: Never true  . Ran Out of Food in the Last Year: Never true  Transportation  Needs: No Transportation Needs  . Lack of Transportation (Medical): No  . Lack of Transportation (Non-Medical): No  Physical Activity: Inactive  . Days of Exercise per Week: 0 days  . Minutes of Exercise per Session: 0 min  Stress: No Stress Concern Present  . Feeling of Stress : Not at all  Social Connections: Moderately Isolated  . Frequency of Communication with Friends and Family: More than three times a week  . Frequency of Social Gatherings with Friends and Family: More than three times a week  . Attends Religious Services: Never  . Active Member of Clubs or Organizations: No  . Attends Archivist Meetings: Never  . Marital Status: Widowed     Family History: The patient's family history includes Cancer in her sister; Diabetes in her son; Diabetes Mellitus II in her brother and brother; Diabetes type II in her mother; Kidney disease in her brother; Prostate cancer in her brother; Stroke in her father and mother.  ROS:   Please see the history of present illness.     All other systems reviewed and are negative.   EKGs/Labs/Other Studies Reviewed:    The following studies were reviewed today:   EKG:  EKG not  ordered today.   Recent Labs: 04/30/2019: ALT 16; Hemoglobin 10.4; Platelets 289 06/08/2019: BUN 20; Creatinine, Ser 1.17; Potassium 4.7; Sodium 138  Recent Lipid Panel    Component Value Date/Time   CHOL 179 04/30/2019 1025   TRIG 90 04/30/2019 1025   HDL 57 04/30/2019 1025   CHOLHDL 3.1 04/30/2019 1025   LDLCALC 104 (H) 04/30/2019 1025    Physical Exam:    VS:  BP (!) 176/80 (BP Location: Left Arm, Patient Position: Sitting, Cuff Size: Normal)   Pulse 82   Ht 5' 3" (1.6 m)   Wt 210 lb (95.3 kg)   SpO2 90%   BMI 37.20 kg/m     Wt Readings from Last 3 Encounters:  07/12/19 210 lb (95.3 kg)  06/08/19 214 lb (97.1 kg)  05/31/19 216 lb (98 kg)     GEN:  Well nourished, well developed in no acute distress HEENT: Normal NECK: No JVD; No  carotid bruits LYMPHATICS: No lymphadenopathy CARDIAC: RRR, 2/6 systolic murmur right upper sternal border, rubs, gallops RESPIRATORY:  Clear to auscultation without rales, wheezing or rhonchi  ABDOMEN: Soft, non-tender, non-distended MUSCULOSKELETAL:  No edema; No deformity  SKIN: Warm and dry NEUROLOGIC:  Alert and oriented x 3 PSYCHIATRIC:  Normal affect   ASSESSMENT:    1. Essential hypertension   2. Heart murmur    PLAN:    In order of problems listed above:  1. Patient with uncontrolled hypertension.  BP still elevated.  Increase losartan to 100 mg daily.  Continue amlodipine 10 mg daily, Coreg 12.5 mg twice daily, chlorthalidone 25 mg daily, clonidine 0.2 mg twice daily.  2. Systolic murmur noted on exam.  Echocardiogram shows normal systolic function, mild LVH.  LVH likely due to hypertension.  Patient made aware of results.  Follow-up in 3 months.  This note was generated in part or whole with voice recognition software. Voice recognition is usually quite accurate but there are transcription errors that can and very often do occur. I apologize for any typographical errors that were not detected and corrected.  Medication Adjustments/Labs and Tests Ordered: Current medicines are reviewed at length with the patient today.  Concerns regarding medicines are outlined above.  No orders of the defined types were placed in this encounter.  Meds ordered this encounter  Medications  . amLODipine (NORVASC) 10 MG tablet    Sig: Take 1 tablet (10 mg total) by mouth daily.    Dispense:  90 tablet    Refill:  1  . losartan (COZAAR) 100 MG tablet    Sig: Take 1 tablet (100 mg total) by mouth daily.    Dispense:  90 tablet    Refill:  1    Patient Instructions  Medication Instructions:  Your physician has recommended you make the following change in your medication:   INCREASE Losartan to 100 mg daily. An Rx has been sent to your pharmacy.  *If you need a refill on your  cardiac medications before your next appointment, please call your pharmacy*   Lab Work: None ordered If you have labs (blood work) drawn today and your tests are completely normal, you will receive your results only by: Marland Kitchen MyChart Message (if you have MyChart) OR . A paper copy in the mail If you have any lab test that is abnormal or we need to change your treatment, we will call you to review the results.   Testing/Procedures: None ordered   Follow-Up: At [facility], you and your health needs are our priority.  As part of our continuing mission to provide you with exceptional heart care, we have created designated Provider Care Teams.  These Care Teams include your primary Cardiologist (physician) and Advanced Practice Providers (APPs -  Physician Assistants and Nurse Practitioners) who all work together to provide you with the care you need, when you need it.  We recommend signing up for the patient portal called "MyChart".  Sign up information is provided on this After Visit Summary.  MyChart is used to connect with patients for Virtual Visits (Telemedicine).  Patients are able to view lab/test results, encounter notes, upcoming appointments, etc.  Non-urgent messages can be sent to your provider as well.   To learn more about what you can do with MyChart, go to [email or web address].    Your next appointment:   3 month(s)  The format for your next appointment:   In Person  Provider:    You may see Kate Sable, MD or one of the following Advanced Practice Providers on your designated Care Team:    Murray Hodgkins, NP  Christell Faith, PA-C  Marrianne Mood, PA-C    Other Instructions N/A     Signed, Kate Sable, MD  07/12/2019 5:19 PM    [place]

## 2019-08-19 LAB — HM DIABETES EYE EXAM

## 2019-09-17 ENCOUNTER — Other Ambulatory Visit: Payer: Self-pay | Admitting: Family Medicine

## 2019-09-17 NOTE — Telephone Encounter (Signed)
So she has currently been on this med, do you want her to stop or call and get refill from cardiologist?

## 2019-09-17 NOTE — Telephone Encounter (Signed)
Charolett Bumpers (daughter) Pt is requesting a refill on cloNIDine. States that her old provider had prescribed it for her. Pt only have enough to last until Monday. Please send to [facility] [address]

## 2019-09-22 ENCOUNTER — Other Ambulatory Visit: Payer: Self-pay

## 2019-09-22 ENCOUNTER — Other Ambulatory Visit: Payer: Self-pay | Admitting: Cardiology

## 2019-09-22 MED ORDER — CLONIDINE HCL 0.2 MG PO TABS: 0.2000 mg | ORAL_TABLET | Freq: Two times a day (BID) | ORAL | 0 refills | Status: DC

## 2019-09-22 NOTE — Telephone Encounter (Signed)
°*  STAT* If patient is at the pharmacy, call can be transferred to refill team.   1. Which medications need to be refilled? (please list name of each medication and dose if known) Clonidine  .2mg  bid  2. Which pharmacy/location (including street and city if local pharmacy) is medication to be sent to? [facility] on  [address]  3. Do they need a 30 day or 90 day supply? 90  Patient previously prescribed this medication by a doctor in but has since moved

## 2019-10-11 ENCOUNTER — Encounter: Payer: Self-pay | Admitting: Cardiology

## 2019-10-11 ENCOUNTER — Ambulatory Visit (INDEPENDENT_AMBULATORY_CARE_PROVIDER_SITE_OTHER): Payer: Medicare Other | Admitting: Cardiology

## 2019-10-11 ENCOUNTER — Other Ambulatory Visit: Payer: Self-pay

## 2019-10-11 VITALS — BP 140/64 | HR 83 | Ht 63.0 in | Wt 210.2 lb

## 2019-10-11 DIAGNOSIS — I10 Essential (primary) hypertension: Secondary | ICD-10-CM | POA: Diagnosis not present

## 2019-10-11 DIAGNOSIS — I517 Cardiomegaly: Secondary | ICD-10-CM | POA: Diagnosis not present

## 2019-10-11 MED ORDER — CARVEDILOL 12.5 MG PO TABS: 12.5000 mg | ORAL_TABLET | Freq: Two times a day (BID) | ORAL | 0 refills | Status: DC

## 2019-10-11 MED ORDER — AMLODIPINE BESYLATE 10 MG PO TABS: 10.0000 mg | ORAL_TABLET | Freq: Every day | ORAL | 0 refills | Status: DC

## 2019-10-11 MED ORDER — HYDRALAZINE HCL 100 MG PO TABS: 100.0000 mg | ORAL_TABLET | Freq: Three times a day (TID) | ORAL | 0 refills | Status: DC

## 2019-10-11 NOTE — Progress Notes (Signed)
Cardiology Office Note:    Date:  10/11/2019   ID:  Jill Larsen, DOB 1951/03/09, MRN [id number]  PCP:  Delsa Grana, PA-C  Cardiologist:  Kate Sable, MD  Electrophysiologist:  None   Referring MD: Delsa Grana, PA-C   Chief Complaint  Patient presents with  . other    3 month f/u no complaints today. Meds reviewed verbally with pt.    History of Present Illness:    Jill Larsen is a 69 y.o. female with a hx of hypertension, diabetes who presents for follow-up. Patient being seen due to uncontrolled blood pressure. Losartan was increased to 100 mg daily after last visit.  She states her blood pressures improved since increasing dose of beta-blocker.  She feels well, has no concerns at this time would like refills on cardiac meds. Previous echocardiogram showed normal systolic function, EF 60 to 65%, mild LVH.   Past Medical History:  Diagnosis Date  . Anxiety   . Blind   . Depression   . Diabetes mellitus without complication ([place])   . Hypertension     Past Surgical History:  Procedure Laterality Date  . EYE SURGERY      Current Medications: Current Meds  Medication Sig  . amLODipine (NORVASC) 10 MG tablet Take 1 tablet (10 mg total) by mouth daily.  . blood glucose meter kit and supplies KIT Dispense based on patient and insurance preference. Use up to four times daily for blood sugar monitoring. (FOR Sx IDDM ICD-10-E11.65).  Marland Kitchen brimonidine (ALPHAGAN) 0.2 % ophthalmic solution Place 1 drop into the right eye 2 (two) times daily.  . carvedilol (COREG) 12.5 MG tablet Take 1 tablet (12.5 mg total) by mouth 2 (two) times daily.  . chlorthalidone (HYGROTON) 25 MG tablet Take 1 tablet (25 mg total) by mouth daily.  . citalopram (CELEXA) 20 MG tablet Take 1 tablet (20 mg total) by mouth daily.  . cloNIDine (CATAPRES) 0.2 MG tablet Take 1 tablet (0.2 mg total) by mouth 2 (two) times daily.  . hydrALAZINE (APRESOLINE) 100 MG tablet Take 1 tablet (100 mg total) by mouth 3  (three) times daily.  . hydrOXYzine (ATARAX/VISTARIL) 10 MG tablet Take 1 tablet (10 mg total) by mouth 3 (three) times daily as needed for anxiety.  . Insulin Detemir (LEVEMIR) 100 UNIT/ML Pen Inject 10-25 Units into the skin 2 (two) times daily. Per instruction from PCP/PA  . Insulin Pen Needle 32G X 4 MM MISC Use as directed with insulin daily  . metFORMIN (GLUCOPHAGE) 1000 MG tablet Take 1 tablet (1,000 mg total) by mouth 2 (two) times daily.  . prednisoLONE acetate (PRED FORTE) 1 % ophthalmic suspension Place 1 drop into the right eye 3 (three) times daily.  . [DISCONTINUED] amLODipine (NORVASC) 10 MG tablet Take 1 tablet (10 mg total) by mouth daily.  . [DISCONTINUED] carvedilol (COREG) 12.5 MG tablet Take 1 tablet (12.5 mg total) by mouth 2 (two) times daily.  . [DISCONTINUED] hydrALAZINE (APRESOLINE) 100 MG tablet TAKE 1 TABLET BY MOUTH THREE TIMES DAILY     Allergies:   Patient has no known allergies.   Social History   Socioeconomic History  . Marital status: Widowed    Spouse name: Not on file  . Number of children: 3  . Years of education: 18  . Highest education level: Some college, no degree  Occupational History  . Not on file  Tobacco Use  . Smoking status: Never Smoker  . Smokeless tobacco: Never Used  Vaping Use  .  Vaping Use: Never used  Substance and Sexual Activity  . Alcohol use: Never  . Drug use: Never  . Sexual activity: Not Currently  Other Topics Concern  . Not on file  Social History Narrative  . Not on file   Social Determinants of Health   Financial Resource Strain: Low Risk   . Difficulty of Paying Living Expenses: Not hard at all  Food Insecurity: No Food Insecurity  . Worried About Charity fundraiser in the Last Year: Never true  . Ran Out of Food in the Last Year: Never true  Transportation Needs: No Transportation Needs  . Lack of Transportation (Medical): No  . Lack of Transportation (Non-Medical): No  Physical Activity: Inactive  .  Days of Exercise per Week: 0 days  . Minutes of Exercise per Session: 0 min  Stress: No Stress Concern Present  . Feeling of Stress : Not at all  Social Connections: Socially Isolated  . Frequency of Communication with Friends and Family: More than three times a week  . Frequency of Social Gatherings with Friends and Family: More than three times a week  . Attends Religious Services: Never  . Active Member of Clubs or Organizations: No  . Attends Archivist Meetings: Never  . Marital Status: Widowed     Family History: The patient's family history includes Cancer in her sister; Diabetes in her son; Diabetes Mellitus II in her brother and brother; Diabetes type II in her mother; Kidney disease in her brother; Prostate cancer in her brother; Stroke in her father and mother.  ROS:   Please see the history of present illness.     All other systems reviewed and are negative.   EKGs/Labs/Other Studies Reviewed:    The following studies were reviewed today:   EKG:  EKG  ordered today.  Shows normal sinus rhythm, heart rate 83  Recent Labs: 04/30/2019: ALT 16; Hemoglobin 10.4; Platelets 289 06/08/2019: BUN 20; Creatinine, Ser 1.17; Potassium 4.7; Sodium 138  Recent Lipid Panel    Component Value Date/Time   CHOL 179 04/30/2019 1025   TRIG 90 04/30/2019 1025   HDL 57 04/30/2019 1025   CHOLHDL 3.1 04/30/2019 1025   LDLCALC 104 (H) 04/30/2019 1025    Physical Exam:    VS:  BP (!) 140/64 (BP Location: Left Arm, Patient Position: Sitting, Cuff Size: Normal)   Pulse 83   Ht '5\' 3"'$  (1.6 m)   Wt (!) 210 lb 4 oz (95.4 kg)   SpO2 98%   BMI 37.24 kg/m     Wt Readings from Last 3 Encounters:  10/11/19 (!) 210 lb 4 oz (95.4 kg)  07/12/19 210 lb (95.3 kg)  06/08/19 214 lb (97.1 kg)     GEN:  Well nourished, well developed in no acute distress HEENT: Normal NECK: No JVD; No carotid bruits LYMPHATICS: No lymphadenopathy CARDIAC: RRR, 2/6 systolic murmur right upper sternal  border, rubs, gallops RESPIRATORY:  Clear to auscultation without rales, wheezing or rhonchi  ABDOMEN: Soft, non-tender, non-distended MUSCULOSKELETAL:  No edema; No deformity  SKIN: Warm and dry NEUROLOGIC:  Alert and oriented x 3 PSYCHIATRIC:  Normal affect   ASSESSMENT:    1. LVH (left ventricular hypertrophy)   2. Essential hypertension    PLAN:    In order of problems listed above:  1. Patient with uncontrolled hypertension.  BP still elevated but much improved.  Continue losartan 100 mg daily, amlodipine 10 mg daily, Coreg 12.5 mg twice daily, chlorthalidone  25 mg daily, clonidine 0.2 mg twice daily.  2. History of mild LVH likely due to chronic hypertension. EF is normal.  Follow-up in 1 year if still in the area.  Patient is planning on moving to [place] since daughter is in the [employer] and stationed in [place]  This note was generated in part or whole with Armed forces training and education officer. Voice recognition is usually quite accurate but there are transcription errors that can and very often do occur. I apologize for any typographical errors that were not detected and corrected.  Medication Adjustments/Labs and Tests Ordered: Current medicines are reviewed at length with the patient today.  Concerns regarding medicines are outlined above.  Orders Placed This Encounter  Procedures  . EKG 12-Lead   Meds ordered this encounter  Medications  . carvedilol (COREG) 12.5 MG tablet    Sig: Take 1 tablet (12.5 mg total) by mouth 2 (two) times daily.    Dispense:  180 tablet    Refill:  0  . hydrALAZINE (APRESOLINE) 100 MG tablet    Sig: Take 1 tablet (100 mg total) by mouth 3 (three) times daily.    Dispense:  270 tablet    Refill:  0  . amLODipine (NORVASC) 10 MG tablet    Sig: Take 1 tablet (10 mg total) by mouth daily.    Dispense:  90 tablet    Refill:  0    Patient Instructions  Medication Instructions:  Your physician recommends that you continue on your current  medications as directed. Please refer to the Current Medication list given to you today.  Your cardiac medications have been refilled for a 90 day supply.  *If you need a refill on your cardiac medications before your next appointment, please call your pharmacy*   Lab Work: None ordered If you have labs (blood work) drawn today and your tests are completely normal, you will receive your results only by: Marland Kitchen MyChart Message (if you have MyChart) OR . A paper copy in the mail If you have any lab test that is abnormal or we need to change your treatment, we will call you to review the results.   Testing/Procedures: None ordered   Follow-Up: At [facility], you and your health needs are our priority.  As part of our continuing mission to provide you with exceptional heart care, we have created designated Provider Care Teams.  These Care Teams include your primary Cardiologist (physician) and Advanced Practice Providers (APPs -  Physician Assistants and Nurse Practitioners) who all work together to provide you with the care you need, when you need it.  We recommend signing up for the patient portal called "MyChart".  Sign up information is provided on this After Visit Summary.  MyChart is used to connect with patients for Virtual Visits (Telemedicine).  Patients are able to view lab/test results, encounter notes, upcoming appointments, etc.  Non-urgent messages can be sent to your provider as well.   To learn more about what you can do with MyChart, go to [email or web address].    Your next appointment:   As needed   The format for your next appointment:   In Person  Provider:    You may see Kate Sable, MD or one of the following Advanced Practice Providers on your designated Care Team:    Murray Hodgkins, NP  Christell Faith, PA-C  Marrianne Mood, PA-C    Other Instructions N/A     Signed, Kate Sable, MD  10/11/2019 4:55 PM  [facility]

## 2019-10-11 NOTE — Patient Instructions (Signed)
Medication Instructions:  Your physician recommends that you continue on your current medications as directed. Please refer to the Current Medication list given to you today.  Your cardiac medications have been refilled for a 90 day supply.  *If you need a refill on your cardiac medications before your next appointment, please call your pharmacy*   Lab Work: None ordered If you have labs (blood work) drawn today and your tests are completely normal, you will receive your results only by: Marland Kitchen MyChart Message (if you have MyChart) OR . A paper copy in the mail If you have any lab test that is abnormal or we need to change your treatment, we will call you to review the results.   Testing/Procedures: None ordered   Follow-Up: At [facility], you and your health needs are our priority.  As part of our continuing mission to provide you with exceptional heart care, we have created designated Provider Care Teams.  These Care Teams include your primary Cardiologist (physician) and Advanced Practice Providers (APPs -  Physician Assistants and Nurse Practitioners) who all work together to provide you with the care you need, when you need it.  We recommend signing up for the patient portal called "MyChart".  Sign up information is provided on this After Visit Summary.  MyChart is used to connect with patients for Virtual Visits (Telemedicine).  Patients are able to view lab/test results, encounter notes, upcoming appointments, etc.  Non-urgent messages can be sent to your provider as well.   To learn more about what you can do with MyChart, go to [email or web address].    Your next appointment:   As needed   The format for your next appointment:   In Person  Provider:    You may see Debbe Odea, MD or one of the following Advanced Practice Providers on your designated Care Team:    Nicolasa Ducking, NP  Eula Listen, PA-C  Marisue Ivan, PA-C    Other Instructions N/A

## 2019-12-20 ENCOUNTER — Other Ambulatory Visit: Payer: Self-pay | Admitting: *Deleted

## 2019-12-20 MED ORDER — CLONIDINE HCL 0.2 MG PO TABS: 0.2000 mg | ORAL_TABLET | Freq: Two times a day (BID) | ORAL | 0 refills | Status: DC

## 2020-01-12 ENCOUNTER — Other Ambulatory Visit: Payer: Self-pay

## 2020-01-12 MED ORDER — CARVEDILOL 12.5 MG PO TABS: 12.5000 mg | ORAL_TABLET | Freq: Two times a day (BID) | ORAL | 3 refills | Status: DC

## 2020-01-19 ENCOUNTER — Other Ambulatory Visit: Payer: Self-pay

## 2020-01-19 MED ORDER — CARVEDILOL 12.5 MG PO TABS: 12.5000 mg | ORAL_TABLET | Freq: Two times a day (BID) | ORAL | 3 refills | Status: DC

## 2020-01-28 DIAGNOSIS — E1142 Type 2 diabetes mellitus with diabetic polyneuropathy: Secondary | ICD-10-CM | POA: Diagnosis not present

## 2020-01-28 DIAGNOSIS — B351 Tinea unguium: Secondary | ICD-10-CM | POA: Diagnosis not present

## 2020-01-28 DIAGNOSIS — L97511 Non-pressure chronic ulcer of other part of right foot limited to breakdown of skin: Secondary | ICD-10-CM | POA: Diagnosis not present

## 2020-01-31 ENCOUNTER — Telehealth: Payer: Self-pay | Admitting: Cardiology

## 2020-01-31 MED ORDER — LOSARTAN POTASSIUM 100 MG PO TABS: 100.0000 mg | ORAL_TABLET | Freq: Every day | ORAL | 1 refills | Status: DC

## 2020-01-31 NOTE — Telephone Encounter (Signed)
Received fax from [facility] on [address] requesting refill for Losartan 100 mg. Rx request sent to pharmacy.

## 2020-02-24 ENCOUNTER — Ambulatory Visit (INDEPENDENT_AMBULATORY_CARE_PROVIDER_SITE_OTHER): Payer: Medicare Other | Admitting: Cardiology

## 2020-02-24 ENCOUNTER — Encounter: Payer: Self-pay | Admitting: Cardiology

## 2020-02-24 ENCOUNTER — Other Ambulatory Visit: Payer: Self-pay

## 2020-02-24 VITALS — BP 140/70 | HR 87 | Ht 63.0 in | Wt 208.0 lb

## 2020-02-24 DIAGNOSIS — I517 Cardiomegaly: Secondary | ICD-10-CM

## 2020-02-24 DIAGNOSIS — I10 Essential (primary) hypertension: Secondary | ICD-10-CM | POA: Diagnosis not present

## 2020-02-24 MED ORDER — IRBESARTAN 300 MG PO TABS: 300.0000 mg | ORAL_TABLET | Freq: Every day | ORAL | 2 refills | Status: DC

## 2020-02-24 NOTE — Patient Instructions (Signed)
Medication Instructions:  Your physician has recommended you make the following change in your medication:  1- STOP Losartan. 2- START Irbesartan 300 mg by mouth once a day.  *If you need a refill on your cardiac medications before your next appointment, please call your pharmacy*  Follow-Up: At [facility], you and your health needs are our priority.  As part of our continuing mission to provide you with exceptional heart care, we have created designated Provider Care Teams.  These Care Teams include your primary Cardiologist (physician) and Advanced Practice Providers (APPs -  Physician Assistants and Nurse Practitioners) who all work together to provide you with the care you need, when you need it.  We recommend signing up for the patient portal called "MyChart".  Sign up information is provided on this After Visit Summary.  MyChart is used to connect with patients for Virtual Visits (Telemedicine).  Patients are able to view lab/test results, encounter notes, upcoming appointments, etc.  Non-urgent messages can be sent to your provider as well.   To learn more about what you can do with MyChart, go to [email or web address].    Your next appointment:   As needed.   The format for your next appointment:   In Person  Provider:   You may see Debbe Odea, MD or one of the following Advanced Practice Providers on your designated Care Team:    Nicolasa Ducking, NP  Eula Listen, PA-C  Marisue Ivan, PA-C  Cadence Dale, [place]  Gillian Shields, NP

## 2020-02-24 NOTE — Progress Notes (Signed)
Cardiology Office Note:    Date:  02/24/2020   ID:  Jill Larsen, DOB [birth year], MRN [id number]  PCP:  Delsa Grana, PA-C  Cardiologist:  Kate Sable, MD  Electrophysiologist:  None   Referring MD: Delsa Grana, PA-C   Chief Complaint  Patient presents with  . Other    Office visit-would like to be evaluated before moving to [place]; Meds verbally reviewed with patient.    History of Present Illness:    Jill Larsen is a 69 y.o. female with a hx of hypertension, diabetes who presents for follow-up.  She is being seen for uncontrolled blood pressures.  Medication titration was performed.  Currently tolerating doses of losartan, Coreg, chlorthalidone, amlodipine, clonidine.  Planning on moving to [place].  Wants to get checked before leaving. She denies any chest pain or shortness of breath.  Feels well.  Blood pressure typically goes up whenever she is stressed or agitated.  Prior notes. Echo 06/9447 normal systolic function, EF 60 to 65%, mild LVH.   Past Medical History:  Diagnosis Date  . Anxiety   . Blind   . Depression   . Diabetes mellitus without complication ([place])   . Hypertension     Past Surgical History:  Procedure Laterality Date  . EYE SURGERY      Current Medications: Current Meds  Medication Sig  . amLODipine (NORVASC) 10 MG tablet Take 1 tablet (10 mg total) by mouth daily.  . blood glucose meter kit and supplies KIT Dispense based on patient and insurance preference. Use up to four times daily for blood sugar monitoring. (FOR Sx IDDM ICD-10-E11.65).  Marland Kitchen brimonidine (ALPHAGAN) 0.2 % ophthalmic solution Place 1 drop into the right eye 2 (two) times daily.  . carvedilol (COREG) 12.5 MG tablet Take 1 tablet (12.5 mg total) by mouth 2 (two) times daily.  . chlorthalidone (HYGROTON) 25 MG tablet Take 1 tablet (25 mg total) by mouth daily.  . citalopram (CELEXA) 20 MG tablet Take 1 tablet (20 mg total) by mouth daily.  . cloNIDine (CATAPRES) 0.2 MG tablet  Take 1 tablet (0.2 mg total) by mouth 2 (two) times daily.  . hydrALAZINE (APRESOLINE) 100 MG tablet Take 1 tablet (100 mg total) by mouth 3 (three) times daily.  . hydrOXYzine (ATARAX/VISTARIL) 10 MG tablet Take 1 tablet (10 mg total) by mouth 3 (three) times daily as needed for anxiety.  . Insulin Detemir (LEVEMIR) 100 UNIT/ML Pen Inject 10-25 Units into the skin 2 (two) times daily. Per instruction from PCP/PA  . Insulin Pen Needle 32G X 4 MM MISC Use as directed with insulin daily  . metFORMIN (GLUCOPHAGE) 1000 MG tablet Take 1 tablet (1,000 mg total) by mouth 2 (two) times daily.  . prednisoLONE acetate (PRED FORTE) 1 % ophthalmic suspension Place 1 drop into the right eye 3 (three) times daily.  . [DISCONTINUED] losartan (COZAAR) 100 MG tablet Take 1 tablet (100 mg total) by mouth daily.     Allergies:   Patient has no known allergies.   Social History   Socioeconomic History  . Marital status: Widowed    Spouse name: Not on file  . Number of children: 3  . Years of education: 10  . Highest education level: Some college, no degree  Occupational History  . Not on file  Tobacco Use  . Smoking status: Never Smoker  . Smokeless tobacco: Never Used  Vaping Use  . Vaping Use: Never used  Substance and Sexual Activity  . Alcohol use: Never  .  Drug use: Never  . Sexual activity: Not Currently  Other Topics Concern  . Not on file  Social History Narrative  . Not on file   Social Determinants of Health   Financial Resource Strain: Low Risk   . Difficulty of Paying Living Expenses: Not hard at all  Food Insecurity: No Food Insecurity  . Worried About Programme researcher, broadcasting/film/video in the Last Year: Never true  . Ran Out of Food in the Last Year: Never true  Transportation Needs: No Transportation Needs  . Lack of Transportation (Medical): No  . Lack of Transportation (Non-Medical): No  Physical Activity: Inactive  . Days of Exercise per Week: 0 days  . Minutes of Exercise per Session:  0 min  Stress: No Stress Concern Present  . Feeling of Stress : Not at all  Social Connections: Socially Isolated  . Frequency of Communication with Friends and Family: More than three times a week  . Frequency of Social Gatherings with Friends and Family: More than three times a week  . Attends Religious Services: Never  . Active Member of Clubs or Organizations: No  . Attends Banker Meetings: Never  . Marital Status: Widowed     Family History: The patient's family history includes Cancer in her sister; Diabetes in her son; Diabetes Mellitus II in her brother and brother; Diabetes type II in her mother; Kidney disease in her brother; Prostate cancer in her brother; Stroke in her father and mother.  ROS:   Please see the history of present illness.     All other systems reviewed and are negative.   EKGs/Labs/Other Studies Reviewed:    The following studies were reviewed today:   EKG:  EKG  ordered today.  Shows normal sinus rhythm, normal ECG.  Recent Labs: 04/30/2019: ALT 16; Hemoglobin 10.4; Platelets 289 06/08/2019: BUN 20; Creatinine, Ser 1.17; Potassium 4.7; Sodium 138  Recent Lipid Panel    Component Value Date/Time   CHOL 179 04/30/2019 1025   TRIG 90 04/30/2019 1025   HDL 57 04/30/2019 1025   CHOLHDL 3.1 04/30/2019 1025   LDLCALC 104 (H) 04/30/2019 1025    Physical Exam:    VS:  BP 140/70 (BP Location: Left Arm, Patient Position: Sitting, Cuff Size: Large)   Pulse 87   Ht 5\' 3"  (1.6 m)   Wt 208 lb (94.3 kg)   SpO2 99%   BMI 36.85 kg/m     Wt Readings from Last 3 Encounters:  02/24/20 208 lb (94.3 kg)  10/11/19 (!) 210 lb 4 oz (95.4 kg)  07/12/19 210 lb (95.3 kg)     GEN:  Well nourished, well developed in no acute distress HEENT: Normal NECK: No JVD; No carotid bruits LYMPHATICS: No lymphadenopathy CARDIAC: RRR, 2/6 systolic murmur right upper sternal border, rubs, gallops RESPIRATORY:  Clear to auscultation without rales, wheezing or  rhonchi  ABDOMEN: Soft, non-tender, non-distended MUSCULOSKELETAL:  No edema; No deformity  SKIN: Warm and dry NEUROLOGIC:  Alert and oriented x 3 PSYCHIATRIC:  Normal affect   ASSESSMENT:    1. Essential hypertension   2. LVH (left ventricular hypertrophy)    PLAN:    In order of problems listed above:  1. Patient with uncontrolled hypertension.  BP still elevated but much improved.  Stop losartan, start irbesartan 300 mg daily.  Continue amlodipine 10 mg daily, Coreg 12.5 mg twice daily, chlorthalidone 25 mg daily, clonidine 0.2 mg twice daily.  2. History of mild LVH likely due to  chronic hypertension. EF is normal.  Follow-up as needed  This note was generated in part or whole with voice recognition software. Voice recognition is usually quite accurate but there are transcription errors that can and very often do occur. I apologize for any typographical errors that were not detected and corrected.  Medication Adjustments/Labs and Tests Ordered: Current medicines are reviewed at length with the patient today.  Concerns regarding medicines are outlined above.  Orders Placed This Encounter  Procedures  . EKG 12-Lead   Meds ordered this encounter  Medications  . irbesartan (AVAPRO) 300 MG tablet    Sig: Take 1 tablet (300 mg total) by mouth daily.    Dispense:  90 tablet    Refill:  2    Patient Instructions  Medication Instructions:  Your physician has recommended you make the following change in your medication:  1- STOP Losartan. 2- START Irbesartan 300 mg by mouth once a day.  *If you need a refill on your cardiac medications before your next appointment, please call your pharmacy*  Follow-Up: At [facility], you and your health needs are our priority.  As part of our continuing mission to provide you with exceptional heart care, we have created designated Provider Care Teams.  These Care Teams include your primary Cardiologist (physician) and Advanced Practice  Providers (APPs -  Physician Assistants and Nurse Practitioners) who all work together to provide you with the care you need, when you need it.  We recommend signing up for the patient portal called "MyChart".  Sign up information is provided on this After Visit Summary.  MyChart is used to connect with patients for Virtual Visits (Telemedicine).  Patients are able to view lab/test results, encounter notes, upcoming appointments, etc.  Non-urgent messages can be sent to your provider as well.   To learn more about what you can do with MyChart, go to [email or web address].    Your next appointment:   As needed.   The format for your next appointment:   In Person  Provider:   You may see Kate Sable, MD or one of the following Advanced Practice Providers on your designated Care Team:    Murray Hodgkins, NP  Christell Faith, PA-C  Marrianne Mood, PA-C  Cadence Roy, [place]  Laurann Montana, NP      Signed, Kate Sable, MD  02/24/2020 12:51 PM    [place]

## 2020-02-29 ENCOUNTER — Ambulatory Visit (INDEPENDENT_AMBULATORY_CARE_PROVIDER_SITE_OTHER): Payer: Medicare Other | Admitting: Family Medicine

## 2020-02-29 ENCOUNTER — Other Ambulatory Visit: Payer: Self-pay

## 2020-02-29 ENCOUNTER — Encounter: Payer: Self-pay | Admitting: Family Medicine

## 2020-02-29 ENCOUNTER — Encounter: Payer: Self-pay | Admitting: Emergency Medicine

## 2020-02-29 VITALS — BP 132/78 | HR 87 | Temp 98.2°F | Resp 16 | Ht 63.0 in | Wt 209.4 lb

## 2020-02-29 DIAGNOSIS — I10 Essential (primary) hypertension: Secondary | ICD-10-CM | POA: Diagnosis not present

## 2020-02-29 DIAGNOSIS — Z1159 Encounter for screening for other viral diseases: Secondary | ICD-10-CM | POA: Diagnosis not present

## 2020-02-29 DIAGNOSIS — Z2821 Immunization not carried out because of patient refusal: Secondary | ICD-10-CM

## 2020-02-29 DIAGNOSIS — F419 Anxiety disorder, unspecified: Secondary | ICD-10-CM

## 2020-02-29 DIAGNOSIS — Z5320 Procedure and treatment not carried out because of patient's decision for unspecified reasons: Secondary | ICD-10-CM

## 2020-02-29 DIAGNOSIS — N1831 Chronic kidney disease, stage 3a: Secondary | ICD-10-CM

## 2020-02-29 DIAGNOSIS — E1165 Type 2 diabetes mellitus with hyperglycemia: Secondary | ICD-10-CM | POA: Diagnosis not present

## 2020-02-29 DIAGNOSIS — F329 Major depressive disorder, single episode, unspecified: Secondary | ICD-10-CM

## 2020-02-29 DIAGNOSIS — E113511 Type 2 diabetes mellitus with proliferative diabetic retinopathy with macular edema, right eye: Secondary | ICD-10-CM

## 2020-02-29 DIAGNOSIS — E785 Hyperlipidemia, unspecified: Secondary | ICD-10-CM | POA: Diagnosis not present

## 2020-02-29 DIAGNOSIS — Z1231 Encounter for screening mammogram for malignant neoplasm of breast: Secondary | ICD-10-CM

## 2020-02-29 DIAGNOSIS — D649 Anemia, unspecified: Secondary | ICD-10-CM

## 2020-02-29 DIAGNOSIS — Z6837 Body mass index (BMI) 37.0-37.9, adult: Secondary | ICD-10-CM

## 2020-02-29 DIAGNOSIS — R0989 Other specified symptoms and signs involving the circulatory and respiratory systems: Secondary | ICD-10-CM | POA: Diagnosis not present

## 2020-02-29 DIAGNOSIS — Z780 Asymptomatic menopausal state: Secondary | ICD-10-CM

## 2020-02-29 DIAGNOSIS — Z8631 Personal history of diabetic foot ulcer: Secondary | ICD-10-CM

## 2020-02-29 DIAGNOSIS — Z5181 Encounter for therapeutic drug level monitoring: Secondary | ICD-10-CM

## 2020-02-29 MED ORDER — AMLODIPINE BESYLATE 10 MG PO TABS: 10.0000 mg | ORAL_TABLET | Freq: Every day | ORAL | 3 refills | Status: DC

## 2020-02-29 MED ORDER — ROSUVASTATIN CALCIUM 10 MG PO TABS: 10.0000 mg | ORAL_TABLET | Freq: Every day | ORAL | 3 refills | Status: DC

## 2020-02-29 MED ORDER — METFORMIN HCL 1000 MG PO TABS: 1000.0000 mg | ORAL_TABLET | Freq: Two times a day (BID) | ORAL | 3 refills | Status: DC

## 2020-02-29 MED ORDER — HYDRALAZINE HCL 100 MG PO TABS: 100.0000 mg | ORAL_TABLET | Freq: Three times a day (TID) | ORAL | 3 refills | Status: DC

## 2020-02-29 MED ORDER — CITALOPRAM HYDROBROMIDE 20 MG PO TABS: 20.0000 mg | ORAL_TABLET | Freq: Every day | ORAL | 3 refills | Status: DC

## 2020-02-29 NOTE — Progress Notes (Signed)
Patient: Jill Larsen, Female    DOB: 11/23/1950, 69 y.o.   MRN: [id number] Delsa Grana, PA-C Visit Date: 02/29/2020  Today's Provider: Delsa Grana, PA-C   Chief Complaint  Patient presents with  . Annual Exam   Subjective:   Annual physical exam:  Jill Larsen is a 69 y.o. female who presents today for complete physical exam:  Exercise/Activity: not exercising or very active Diet/nutrition:  Trying to eat healthy for DM, HTN and HLD Sleep: sleeps ok  Pt wished to do routine f/up on chronic conditions today in addition to CPE. Advised pt of separate visit billing/coding Has not come in for multiple uncontrolled chronic conditions since March 2021 - including IDDM Patient has been taking Metformin 1000 mg twice daily and she has increased her Levemir dose to 45 to 50 units  at bedtime and 25 units in am -last A1c was in February, at that time she was not monitoring her blood sugars Lab Results  Component Value Date   HGBA1C 7.8 (H) 04/30/2019  She reports that she has not had any more hypoglycemia and reports blood sugar is 90-125  MDD and anxiety -she is prescribed Celexa 20 mg but she reports taking it some days not every day she does feel like it makes her sleepy but she refused to have the dose decrease to 10 mg daily I did asked that staff get a full PHQ 9 and gad 7 screening today -but unfortunately it was not completed  HTN -on multiple medications and seeing specialist, reviewed specialist office visit and med changes -blood pressure is well controlled today and at goal, currently managed with carvedilol 12.5 mg twice daily, clonidine 0.2 mg twice daily, Norvasc 10 mg daily, hydralazine 100 mg 3 times daily, and losartan 100 mg was changed to irbesartan 300 mg once daily  Patient has indication for statin but she is not currently taking  USPSTF grade A and B recommendations - reviewed and addressed today  Depression:  Phq 9 completed today by patient, was reviewed  by me with patient in the room PHQ score is neg, pt feels good PHQ 2/9 Scores 02/29/2020 05/31/2019 04/23/2019 04/09/2019  PHQ - 2 Score 0 0 0 0  PHQ- 9 Score - 0 0 -   Depression screen [facility] 2/9 02/29/2020 05/31/2019 04/23/2019 04/09/2019 03/17/2019  Decreased Interest 0 0 0 0 0  Down, Depressed, Hopeless 0 0 0 0 0  PHQ - 2 Score 0 0 0 0 0  Altered sleeping - 0 0 - 0  Tired, decreased energy - 0 0 - 0  Change in appetite - 0 0 - 0  Feeling bad or failure about yourself  - 0 0 - 0  Trouble concentrating - 0 0 - 0  Moving slowly or fidgety/restless - 0 0 - 0  Suicidal thoughts - 0 0 - 0  PHQ-9 Score - 0 0 - 0  Difficult doing work/chores - Not difficult at all Not difficult at all - Not difficult at all    Alcohol screening: [place] Office Visit from 05/31/2019 in [facility]  AUDIT-C Score 0      Immunizations and Health Maintenance: Health Maintenance  Topic Date Due  . Hepatitis C Screening  Never done  . MAMMOGRAM  Never done  . COLONOSCOPY  Never done  . DEXA SCAN  Never done  . HEMOGLOBIN A1C  10/28/2019  . COVID-19 Vaccine (3 - Booster for Pfizer series) 01/07/2020  . TETANUS/TDAP  03/16/2020 (Originally 01/05/1972)  . PNA vac Low Risk Adult (1 of 2 - PCV13) 03/18/2020 (Originally 01/04/2018)  . INFLUENZA VACCINE  06/15/2020 (Originally 10/17/2019)  . FOOT EXAM  03/16/2020  . OPHTHALMOLOGY EXAM  08/18/2020     Hep C Screening: due  STD testing and prevention (HIV/chl/gon/syphilis):  see above, no additional testing desired by pt today  Intimate partner violence:  Denies any abuse  Sexual History/Pain during Intercourse: Widowed  Menstrual History/LMP/Abnormal Bleeding: denies any AUB No LMP recorded. Patient is postmenopausal.  Incontinence Symptoms: none  Breast cancer: Previously mammogram was ordered but not completed Last Mammogram: *see HM list above  Cervical cancer screening: n/a aged out Pt denies family hx of cancers - breast,  ovarian, uterine, colon:     Osteoporosis: DEXA ordered but not done Discussion on osteoporosis per age, including high calcium and vitamin D supplementation, weight bearing exercises Pt is  supplementing with daily calcium/Vit D. Not on Vit D or calcium   Skin cancer:  Hx of skin CA -  NO Discussed atypical lesions   Colorectal cancer:  No family hx, cannot Colonoscopy is due, patient declines Discussed concerning signs and sx of CRC, pt denies blood in stool, melena, hematochezia or change in bowel movements, no unintentional weight loss or vague abdominal pain  Lung cancer:   Low Dose CT Chest recommended if Age 42-80 years, 30 pack-year currently smoking OR have quit w/in 15years. Patient does not qualify.    Social History   Tobacco Use  . Smoking status: Never Smoker  . Smokeless tobacco: Never Used  Vaping Use  . Vaping Use: Never used  Substance Use Topics  . Alcohol use: Never  . Drug use: Never     [place] Office Visit from 05/31/2019 in [facility]  AUDIT-C Score 0      Family History  Problem Relation Age of Onset  . Stroke Mother   . Diabetes type II Mother   . Stroke Father   . Diabetes Mellitus II Brother   . Cancer Sister   . Diabetes Son   . Diabetes Mellitus II Brother   . Kidney disease Brother   . Prostate cancer Brother      Blood pressure/Hypertension: BP Readings from Last 3 Encounters:  02/29/20 132/78  02/24/20 140/70  10/11/19 (!) 140/64    Weight/Obesity: Wt Readings from Last 3 Encounters:  02/29/20 209 lb 6.4 oz (95 kg)  02/24/20 208 lb (94.3 kg)  10/11/19 (!) 210 lb 4 oz (95.4 kg)   BMI Readings from Last 3 Encounters:  02/29/20 37.09 kg/m  02/24/20 36.85 kg/m  10/11/19 37.24 kg/m     Lipids:  Lab Results  Component Value Date   CHOL 179 04/30/2019   Lab Results  Component Value Date   HDL 57 04/30/2019   Lab Results  Component Value Date   LDLCALC 104 (H) 04/30/2019   Lab Results   Component Value Date   TRIG 90 04/30/2019   Lab Results  Component Value Date   CHOLHDL 3.1 04/30/2019   No results found for: LDLDIRECT Based on the results of lipid panel his/her cardiovascular risk factor ( using [place] )  in the next 10 years is: The 10-year ASCVD risk score Mikey Bussing DC Brooke Bonito., et al., 2013) is: 22%   Values used to calculate the score:     Age: 24 years     Sex: Female     Is Non-Hispanic African American: Yes  Diabetic: Yes     Tobacco smoker: No     Systolic Blood Pressure: 831 mmHg     Is BP treated: Yes     HDL Cholesterol: 57 mg/dL     Total Cholesterol: 179 mg/dL Glucose:  Glucose, Bld  Date Value Ref Range Status  06/08/2019 174 (H) 70 - 99 mg/dL Final    Comment:    Glucose reference range applies only to samples taken after fasting for at least 8 hours.  04/30/2019 130 (H) 65 - 99 mg/dL Final    Comment:    .            Fasting reference interval . For someone without known diabetes, a glucose value >125 mg/dL indicates that they may have diabetes and this should be confirmed with a follow-up test. .   02/09/2019 120 (H) 65 - 99 mg/dL Final    Comment:    .            Fasting reference interval . For someone without known diabetes, a glucose value between 100 and 125 mg/dL is consistent with prediabetes and should be confirmed with a follow-up test. .    Glucose-Capillary  Date Value Ref Range Status  01/23/2019 177 (H) 70 - 99 mg/dL Final  01/23/2019 113 (H) 70 - 99 mg/dL Final  01/22/2019 117 (H) 70 - 99 mg/dL Final   Hypertension: BP Readings from Last 3 Encounters:  02/29/20 132/78  02/24/20 140/70  10/11/19 (!) 140/64   Obesity: Wt Readings from Last 3 Encounters:  02/29/20 209 lb 6.4 oz (95 kg)  02/24/20 208 lb (94.3 kg)  10/11/19 (!) 210 lb 4 oz (95.4 kg)   BMI Readings from Last 3 Encounters:  02/29/20 37.09 kg/m  02/24/20 36.85 kg/m  10/11/19 37.24 kg/m    Advanced Care Planning:  A voluntary  discussion about advance care planning including the explanation and discussion of advance directives.    Social History      She        Social History   Socioeconomic History  . Marital status: Widowed    Spouse name: Not on file  . Number of children: 3  . Years of education: 36  . Highest education level: Some college, no degree  Occupational History  . Not on file  Tobacco Use  . Smoking status: Never Smoker  . Smokeless tobacco: Never Used  Vaping Use  . Vaping Use: Never used  Substance and Sexual Activity  . Alcohol use: Never  . Drug use: Never  . Sexual activity: Not Currently  Other Topics Concern  . Not on file  Social History Narrative  . Not on file   Social Determinants of Health   Financial Resource Strain: Low Risk   . Difficulty of Paying Living Expenses: Not hard at all  Food Insecurity: No Food Insecurity  . Worried About Charity fundraiser in the Last Year: Never true  . Ran Out of Food in the Last Year: Never true  Transportation Needs: No Transportation Needs  . Lack of Transportation (Medical): No  . Lack of Transportation (Non-Medical): No  Physical Activity: Inactive  . Days of Exercise per Week: 0 days  . Minutes of Exercise per Session: 0 min  Stress: No Stress Concern Present  . Feeling of Stress : Only a little  Social Connections: Moderately Isolated  . Frequency of Communication with Friends and Family: More than three times a week  . Frequency of Social Gatherings  with Friends and Family: More than three times a week  . Attends Religious Services: More than 4 times per year  . Active Member of Clubs or Organizations: No  . Attends Archivist Meetings: Never  . Marital Status: Widowed    Family History        Family History  Problem Relation Age of Onset  . Stroke Mother   . Diabetes type II Mother   . Stroke Father   . Diabetes Mellitus II Brother   . Cancer Sister   . Diabetes Son   . Diabetes Mellitus II  Brother   . Kidney disease Brother   . Prostate cancer Brother     Patient Active Problem List   Diagnosis Date Noted  . Uncontrolled type 2 diabetes mellitus with hyperglycemia ([place]) 04/28/2019  . Diabetes mellitus ([place]) 04/28/2019  . Labile hypertension 04/28/2019  . Ulcer of right foot ([place]) 04/28/2019  . AKI (acute kidney injury) ([place]) 01/20/2019  . Diabetes mellitus without complication ([place])   . Hypertension   . Hypertensive emergency   . Hyperkalemia   . Diabetic macular edema ([place]) 05/21/2018  . 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 Vitreous hemorrhage of right eye ([place]) 02/14/2017    Past Surgical History:  Procedure Laterality Date  . EYE SURGERY       Current Outpatient Medications:  .  amLODipine (NORVASC) 10 MG tablet, Take 1 tablet (10 mg total) by mouth daily., Disp: 90 tablet, Rfl: 0 .  blood glucose meter kit and supplies KIT, Dispense based on patient and insurance preference. Use up to four times daily for blood sugar monitoring. (FOR Sx IDDM ICD-10-E11.65)., Disp: 1 each, Rfl: 0 .  brimonidine (ALPHAGAN) 0.2 % ophthalmic solution, Place 1 drop into the right eye 2 (two) times daily., Disp: , Rfl:  .  carvedilol (COREG) 12.5 MG tablet, Take 1 tablet (12.5 mg total) by mouth 2 (two) times daily., Disp: 180 tablet, Rfl: 3 .  citalopram (CELEXA) 20 MG tablet, Take 1 tablet (20 mg total) by mouth daily., Disp: 90 tablet, Rfl: 3 .  cloNIDine (CATAPRES) 0.2 MG tablet, Take 1 tablet (0.2 mg total) by mouth 2 (two) times daily., Disp: 180 tablet, Rfl: 0 .  hydrALAZINE (APRESOLINE) 100 MG tablet, Take 1 tablet (100 mg total) by mouth 3 (three) times daily., Disp: 270 tablet, Rfl: 0 .  hydrOXYzine (ATARAX/VISTARIL) 10 MG tablet, Take 1 tablet (10 mg total) by mouth 3 (three) times daily as needed for anxiety., Disp: 90 tablet, Rfl:  1 .  Insulin Detemir (LEVEMIR) 100 UNIT/ML Pen, Inject 10-25 Units into the skin 2 (two) times daily. Per instruction from PCP/PA, Disp: 15 mL, Rfl: 6 .  Insulin Pen Needle 32G X 4 MM MISC, Use as directed with insulin daily, Disp: 100 each, Rfl: 3 .  irbesartan (AVAPRO) 300 MG tablet, Take 1 tablet (300 mg total) by mouth daily., Disp: 90 tablet, Rfl: 2 .  metFORMIN (GLUCOPHAGE) 1000 MG tablet, Take 1 tablet (1,000 mg total) by mouth 2 (two) times daily., Disp: 180 tablet, Rfl: 3 .  prednisoLONE acetate (PRED FORTE) 1 % ophthalmic suspension, Place 1 drop into the right eye 3 (three) times daily., Disp: , Rfl:  .  chlorthalidone (HYGROTON) 25 MG tablet, Take 1 tablet (25 mg total) by mouth daily., Disp: 90 tablet, Rfl: 3  No Known Allergies  Patient Care Team: Danelle Berry, PA-C as PCP - General (Family Medicine) Debbe Odea, MD as PCP - Cardiology (Cardiology)  Review of Systems  10 Systems reviewed and are negative for acute change except as noted in the HPI.   I personally reviewed active problem list, medication list, allergies, family history, social history, health maintenance, notes from last encounter, lab results, imaging with the patient/caregiver today.        Objective:   Vitals:  Vitals:   02/29/20 1059  BP: 132/78  Pulse: 87  Resp: 16  Temp: 98.2 F (36.8 C)  SpO2: 96%  Weight: 209 lb 6.4 oz (95 kg)  Height: 5\' 3"  (1.6 m)    Body mass index is 37.09 kg/m.  Physical Exam Vitals and nursing note reviewed.  Constitutional:      General: She is not in acute distress.    Appearance: Normal appearance. She is well-developed. She is obese. She is not ill-appearing, toxic-appearing or diaphoretic.     Interventions: Face mask in place.  HENT:     Head: Normocephalic and atraumatic.     Right Ear: External ear normal.     Left Ear: External ear normal.  Eyes:     General: Lids are normal. No scleral icterus.       Right eye: No discharge.        Left  eye: No discharge.     Conjunctiva/sclera: Conjunctivae normal.  Neck:     Trachea: Phonation normal. No tracheal deviation.  Cardiovascular:     Rate and Rhythm: Normal rate and regular rhythm.     Pulses: Normal pulses.          Radial pulses are 2+ on the right side and 2+ on the left side.       Posterior tibial pulses are 2+ on the right side and 2+ on the left side.     Heart sounds: Normal heart sounds. No murmur heard. No friction rub. No gallop.   Pulmonary:     Effort: Pulmonary effort is normal. No respiratory distress.     Breath sounds: Normal breath sounds. No stridor. No wheezing, rhonchi or rales.  Chest:     Chest wall: No tenderness.  Abdominal:     General: Bowel sounds are normal. There is no distension.     Palpations: Abdomen is soft.  Musculoskeletal:     Right lower leg: No edema.     Left lower leg: No edema.  Skin:    General: Skin is warm and dry.     Coloration: Skin is not jaundiced or pale.     Findings: No rash.  Neurological:     Mental Status: She is alert.     Motor: No abnormal muscle tone.     Gait: Gait normal.  Psychiatric:        Mood and Affect: Mood normal.        Speech: Speech normal.        Behavior: Behavior normal.    Diabetic Foot Exam - Simple   Simple Foot Form Diabetic Foot exam was performed with the following findings: Yes 02/29/2020 11:40 AM  Visual Inspection Sensation Testing Pulse Check Comments        Fall Risk: Fall Risk  02/29/2020 05/31/2019 04/23/2019 04/09/2019 03/17/2019  Falls in the past year? 0 0 0 1 1  Number falls in past yr: 0 0 0 1 1  Injury with Fall? 0 0 0 0 0  Risk for fall  due to : - - - History of fall(s);Impaired vision -  Follow up - - - Falls prevention discussed -    Functional Status Survey: Is the patient deaf or have difficulty hearing?: No Does the patient have difficulty seeing, even when wearing glasses/contacts?: Yes Does the patient have difficulty concentrating,  remembering, or making decisions?: No Does the patient have difficulty walking or climbing stairs?: Yes Does the patient have difficulty dressing or bathing?: No Does the patient have difficulty doing errands alone such as visiting a doctor's office or shopping?: No   Assessment & Plan:    CPE completed today  . USPSTF grade A and B recommendations reviewed with patient; age-appropriate recommendations, preventive care, screening tests, etc discussed and encouraged; healthy living encouraged; see AVS for patient education given to patient  . Discussed importance of 150 minutes of physical activity weekly, AHA exercise recommendations given to pt in AVS/handout  . Discussed importance of healthy diet:  eating lean meats and proteins, avoiding trans fats and saturated fats, avoid simple sugars and excessive carbs in diet, eat 6 servings of fruit/vegetables daily and drink plenty of water and avoid sweet beverages.    . Recommended pt to do annual eye exam and routine dental exams/cleanings  . Depression, alcohol, fall screening completed as documented above and per flowsheets  . Reviewed Health Maintenance: Health Maintenance  Topic Date Due  . Hepatitis C Screening  Never done  . MAMMOGRAM  Never done  . COLONOSCOPY  Never done  . DEXA SCAN  Never done  . HEMOGLOBIN A1C  10/28/2019  . COVID-19 Vaccine (3 - Booster for Pfizer series) 01/07/2020  . TETANUS/TDAP  03/16/2020 (Originally 01/05/1972)  . PNA vac Low Risk Adult (1 of 2 - PCV13) 03/18/2020 (Originally 01/04/2018)  . INFLUENZA VACCINE  06/15/2020 (Originally 10/17/2019)  . FOOT EXAM  03/16/2020  . OPHTHALMOLOGY EXAM  08/18/2020    . Immunizations: Immunization History  Administered Date(s) Administered  . PFIZER SARS-COV-2 Vaccination 06/17/2019, 07/08/2019   Health Maintenance  Topic Date Due  .  Hepatitis C: One time screening is recommended by Center for Disease Control  (CDC) for  adults born from [birth year] through 1963.    Never done  . Mammogram  Never done  . DEXA scan (bone density measurement)  Never done  . Hemoglobin A1C  10/28/2019  . COVID-19 Vaccine (3 - Booster for Pfizer series) 01/07/2020  . Tetanus Vaccine  03/16/2020*  . Pneumonia vaccines (1 of 2 - PCV13) 03/18/2020*  . Flu Shot  06/15/2020*  . Colon Cancer Screening  02/28/2021*  . Eye exam for diabetics  08/18/2020  . Complete foot exam   02/28/2021  *Topic was postponed. The date shown is not the original due date.   Attempted to review everything on the patient's health maintenance list and all of her gaps in her care however she declined to do most things today.  Many things have been recommended and/or ordered in the past but not completed by patient all year long.  She is going to move to [place] in the next month and today declined flu shot, pneumonia vaccine, tetanus booster, referral for colonoscopy, her mammogram and bone density were ordered again and she was given information on how to call and schedule    ICD-10-CM   1. Uncontrolled type 2 diabetes mellitus with hyperglycemia (HCC)  L87.56 COMPLETE METABOLIC PANEL WITH GFR    Hemoglobin A1c    metFORMIN (GLUCOPHAGE) 1000 MG tablet   Has been  uncontrolled, on insulin and Metformin, recheck A1c today  2. Hypertension, unspecified type  Y77 COMPLETE METABOLIC PANEL WITH GFR    amLODipine (NORVASC) 10 MG tablet    hydrALAZINE (APRESOLINE) 100 MG tablet   Has consulted with cardiologist specialist medications adjusted blood pressure at goal today  3. Labile hypertension  A12.87 COMPLETE METABOLIC PANEL WITH GFR    amLODipine (NORVASC) 10 MG tablet    hydrALAZINE (APRESOLINE) 100 MG tablet   See above, reviewed cardiology office visit and all medications with patient and her daughter-in-law  4. Anemia, unspecified type  D64.9 CBC with Differential/Platelet   Monitoring H&H  5. Stage 3a chronic kidney disease (HCC)  N18.31 CBC with Differential/Platelet    COMPLETE METABOLIC PANEL  WITH GFR   Recheck renal function, blood pressure much better controlled continue good hydration and avoiding NSAIDs  6. 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 Major depressive disorder with current active episode, unspecified depression episode severity, unspecified whether recurrent  F32.9 citalopram (CELEXA) 20 MG tablet   Poor compliance with Celexa 20 mg, PHQ 2 was negative unfortunately PHQ-9 and anxiety screening was not done  8. Anxiety disorder, unspecified type  F41.9 citalopram (CELEXA) 20 MG tablet   Encouraged her to take Celexa daily offer to decrease dose from 20 to 10 mg but she declined, hydroxyzine as needed but it does cause somnolence  9. Hyperlipidemia, unspecified hyperlipidemia type  N47.0 COMPLETE METABOLIC PANEL WITH GFR    Lipid panel   Recheck lipid panel, not on statins with no past noted allergy, patient not interested in the past will recommend again  10. Encounter for hepatitis C screening test for low risk patient  Z11.59 Hepatitis C antibody  11. Class 2 severe obesity with serious comorbidity and body mass index (BMI) of 37.0 to 37.9 in adult, unspecified obesity type ([place])  E66.01    Z68.37    Morbid obesity with multiple comorbidities including uncontrolled IDDM, hypertension, hyperlipidemia, chronic kidney disease  12. H/O diabetic foot ulcer  Z86.31    Prior ulcers and wounds managed by podiatry, improved, foot exam done today, nails and skin appear healthy, no wounds swelling, redness  13. Encounter for medication monitoring  Z51.81 CBC with Differential/Platelet    COMPLETE METABOLIC PANEL WITH GFR    Lipid panel    Hemoglobin A1c    Hepatitis C antibody  14. Colonoscopy refused  Z53.20   15. Refused influenza vaccine  Z28.21   16. Pneumococcal vaccine refused  Z28.21   17. Tetanus toxoid vaccination refused  Z28.21    18. Postmenopausal estrogen deficiency  Z78.0 DG Bone Density  19. Encounter for screening mammogram for malignant neoplasm of breast  Z12.31 MM 3D SCREEN BREAST BILATERAL      Delsa Grana, PA-C 02/29/20 11:39 AM  [facility]

## 2020-02-29 NOTE — Patient Instructions (Addendum)
[facility],    [id number] Get Driving Directions Main: [phone]    Preventive Care 74 Years and Older, Female Preventive care refers to lifestyle choices and visits with your health care provider that can promote health and wellness. This includes:  A yearly physical exam. This is also called an annual well check.  Regular dental and eye exams.  Immunizations.  Screening for certain conditions.  Healthy lifestyle choices, such as diet and exercise. What can I expect for my preventive care visit? Physical exam Your health care provider will check:  Height and weight. These may be used to calculate body mass index (BMI), which is a measurement that tells if you are at a healthy weight.  Heart rate and blood pressure.  Your skin for abnormal spots. Counseling Your health care provider may ask you questions about:  Alcohol, tobacco, and drug use.  Emotional well-being.  Home and relationship well-being.  Sexual activity.  Eating habits.  History of falls.  Memory and ability to understand (cognition).  Work and work Statistician.  Pregnancy and menstrual history. What immunizations do I need?  Influenza (flu) vaccine  This is recommended every year. Tetanus, diphtheria, and pertussis (Tdap) vaccine  You may need a Td booster every 10 years. Varicella (chickenpox) vaccine  You may need this vaccine if you have not already been vaccinated. Zoster (shingles) vaccine  You may need this after age 43. Pneumococcal conjugate (PCV13) vaccine  One dose is recommended after age 57. Pneumococcal polysaccharide (PPSV23) vaccine  One dose is recommended after age 66. Measles, mumps, and rubella (MMR) vaccine  You may need at least one dose of MMR if you were born in 1957 or later. You may also need a second dose. Meningococcal conjugate (MenACWY) vaccine  You may need this if you have  certain conditions. Hepatitis A vaccine  You may need this if you have certain conditions or if you travel or work in places where you may be exposed to hepatitis A. Hepatitis B vaccine  You may need this if you have certain conditions or if you travel or work in places where you may be exposed to hepatitis B. Haemophilus influenzae type b (Hib) vaccine  You may need this if you have certain conditions. You may receive vaccines as individual doses or as more than one vaccine together in one shot (combination vaccines). Talk with your health care provider about the risks and benefits of combination vaccines. What tests do I need? Blood tests  Lipid and cholesterol levels. These may be checked every 5 years, or more frequently depending on your overall health.  Hepatitis C test.  Hepatitis B test. Screening  Lung cancer screening. You may have this screening every year starting at age 1 if you have a 30-pack-year history of smoking and currently smoke or have quit within the past 15 years.  Colorectal cancer screening. All adults should have this screening starting at age 56 and continuing until age 75. Your health care provider may recommend screening at age 50 if you are at increased risk. You will have tests every 1-10 years, depending on your results and the type of screening test.  Diabetes screening. This is done by checking your blood sugar (glucose) after you have not eaten for a while (fasting). You may have this done every 1-3 years.  Mammogram. This may be done every 1-2 years. Talk with your health care provider about how often you should  have regular mammograms.  BRCA-related cancer screening. This may be done if you have a family history of breast, ovarian, tubal, or peritoneal cancers. Other tests  Sexually transmitted disease (STD) testing.  Bone density scan. This is done to screen for osteoporosis. You may have this done starting at age 38. Follow these  instructions at home: Eating and drinking  Eat a diet that includes fresh fruits and vegetables, whole grains, lean protein, and low-fat dairy products. Limit your intake of foods with high amounts of sugar, saturated fats, and salt.  Take vitamin and mineral supplements as recommended by your health care provider.  Do not drink alcohol if your health care provider tells you not to drink.  If you drink alcohol: ? Limit how much you have to 0-1 drink a day. ? Be aware of how much alcohol is in your drink. In the [place] one drink equals one 12 oz bottle of beer (355 mL), one 5 oz glass of wine (148 mL), or one 1 oz glass of hard liquor (44 mL). Lifestyle  Take daily care of your teeth and gums.  Stay active. Exercise for at least 30 minutes on 5 or more days each week.  Do not use any products that contain nicotine or tobacco, such as cigarettes, e-cigarettes, and chewing tobacco. If you need help quitting, ask your health care provider.  If you are sexually active, practice safe sex. Use a condom or other form of protection in order to prevent STIs (sexually transmitted infections).  Talk with your health care provider about taking a low-dose aspirin or statin. What's next?  Go to your health care provider once a year for a well check visit.  Ask your health care provider how often you should have your eyes and teeth checked.  Stay up to date on all vaccines. This information is not intended to replace advice given to you by your health care provider. Make sure you discuss any questions you have with your health care provider. Document Revised: 02/26/2018 Document Reviewed: 02/26/2018 Elsevier Patient Education  2020 [employer].

## 2020-02-29 NOTE — Progress Notes (Deleted)
Name: Jill Larsen   MRN: [id number]    DOB: [birth year]   Date:02/29/2020       Progress Note  Chief Complaint  Patient presents with  . Annual Exam     Subjective:   Jill Larsen is a 69 y.o. female, presents to clinic for     ***   Current Outpatient Medications:  .  amLODipine (NORVASC) 10 MG tablet, Take 1 tablet (10 mg total) by mouth daily., Disp: 90 tablet, Rfl: 0 .  blood glucose meter kit and supplies KIT, Dispense based on patient and insurance preference. Use up to four times daily for blood sugar monitoring. (FOR Sx IDDM ICD-10-E11.65)., Disp: 1 each, Rfl: 0 .  brimonidine (ALPHAGAN) 0.2 % ophthalmic solution, Place 1 drop into the right eye 2 (two) times daily., Disp: , Rfl:  .  carvedilol (COREG) 12.5 MG tablet, Take 1 tablet (12.5 mg total) by mouth 2 (two) times daily., Disp: 180 tablet, Rfl: 3 .  chlorthalidone (HYGROTON) 25 MG tablet, Take 1 tablet (25 mg total) by mouth daily., Disp: 90 tablet, Rfl: 3 .  citalopram (CELEXA) 20 MG tablet, Take 1 tablet (20 mg total) by mouth daily., Disp: 90 tablet, Rfl: 3 .  cloNIDine (CATAPRES) 0.2 MG tablet, Take 1 tablet (0.2 mg total) by mouth 2 (two) times daily., Disp: 180 tablet, Rfl: 0 .  hydrALAZINE (APRESOLINE) 100 MG tablet, Take 1 tablet (100 mg total) by mouth 3 (three) times daily., Disp: 270 tablet, Rfl: 0 .  hydrOXYzine (ATARAX/VISTARIL) 10 MG tablet, Take 1 tablet (10 mg total) by mouth 3 (three) times daily as needed for anxiety., Disp: 90 tablet, Rfl: 1 .  Insulin Detemir (LEVEMIR) 100 UNIT/ML Pen, Inject 10-25 Units into the skin 2 (two) times daily. Per instruction from PCP/PA, Disp: 15 mL, Rfl: 6 .  Insulin Pen Needle 32G X 4 MM MISC, Use as directed with insulin daily, Disp: 100 each, Rfl: 3 .  irbesartan (AVAPRO) 300 MG tablet, Take 1 tablet (300 mg total) by mouth daily., Disp: 90 tablet, Rfl: 2 .  metFORMIN (GLUCOPHAGE) 1000 MG tablet, Take 1 tablet (1,000 mg total) by mouth 2 (two) times daily., Disp:  180 tablet, Rfl: 3 .  prednisoLONE acetate (PRED FORTE) 1 % ophthalmic suspension, Place 1 drop into the right eye 3 (three) times daily., Disp: , Rfl:   Patient Active Problem List   Diagnosis Date Noted  . Uncontrolled type 2 diabetes mellitus with hyperglycemia ([place]) 04/28/2019  . Diabetes mellitus ([place]) 04/28/2019  . Labile hypertension 04/28/2019  . Ulcer of right foot ([place]) 04/28/2019  . AKI (acute kidney injury) ([place]) 01/20/2019  . Diabetes mellitus without complication ([place])   . Hypertension   . Hypertensive emergency   . Hyperkalemia   . Diabetic macular edema ([place]) 05/21/2018  . 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 Vitreous hemorrhage of right eye ([place]) 02/14/2017    Past Surgical History:  Procedure Laterality Date  . EYE SURGERY      Family History  Problem Relation Age of Onset  . Stroke Mother   . Diabetes type II Mother   . Stroke Father   . Diabetes Mellitus II Brother   . Cancer Sister   . Diabetes Son   . Diabetes Mellitus II Brother   . Kidney disease Brother   . Prostate cancer Brother     Social  History   Tobacco Use  . Smoking status: Never Smoker  . Smokeless tobacco: Never Used  Vaping Use  . Vaping Use: Never used  Substance Use Topics  . Alcohol use: Never  . Drug use: Never     No Known Allergies  Health Maintenance  Topic Date Due  . Hepatitis C Screening  Never done  . COVID-19 Vaccine (1) Never done  . MAMMOGRAM  Never done  . COLONOSCOPY  Never done  . DEXA SCAN  Never done  . INFLUENZA VACCINE  Never done  . HEMOGLOBIN A1C  10/28/2019  . OPHTHALMOLOGY EXAM  01/05/2020  . TETANUS/TDAP  03/16/2020 (Originally 01/[birth year]/1972)  . PNA vac Low Risk Adult (1 of 2 - PCV13) 03/18/2020 (Originally 01/04/2018)  . FOOT EXAM  03/16/2020    Chart Review Today: ***  Review of Systems    Objective:   There were no vitals filed for this visit.  There is no height or weight on file to calculate BMI.  Physical Exam      Assessment & Plan:   ***  No follow-ups on file.   Delsa Grana, PA-C 02/29/20 10:58 AM

## 2020-03-01 LAB — HEMOGLOBIN A1C
Hgb A1c MFr Bld: 9.3 % of total Hgb — ABNORMAL HIGH (ref <5.7)
Mean Plasma Glucose: 220 mg/dL
eAG (mmol/L): 12.2 mmol/L

## 2020-03-01 LAB — CBC WITH DIFFERENTIAL/PLATELET
Absolute Monocytes: 484 cells/uL (ref 200–950)
Basophils Absolute: 61 cells/uL (ref 0–200)
Basophils Relative: 1.1 %
Eosinophils Absolute: 160 cells/uL (ref 15–500)
Eosinophils Relative: 2.9 %
HCT: 33.9 % — ABNORMAL LOW (ref 35.0–45.0)
Hemoglobin: 10.9 g/dL — ABNORMAL LOW (ref 11.7–15.5)
Lymphs Abs: 1513 cells/uL (ref 850–3900)
MCH: 26.7 pg — ABNORMAL LOW (ref 27.0–33.0)
MCHC: 32.2 g/dL (ref 32.0–36.0)
MCV: 83.1 fL (ref 80.0–100.0)
MPV: 11.4 fL (ref 7.5–12.5)
Monocytes Relative: 8.8 %
Neutro Abs: 3284 cells/uL (ref 1500–7800)
Neutrophils Relative %: 59.7 %
Platelets: 279 10*3/uL (ref 140–400)
RBC: 4.08 10*6/uL (ref 3.80–5.10)
RDW: 13.8 % (ref 11.0–15.0)
Total Lymphocyte: 27.5 %
WBC: 5.5 10*3/uL (ref 3.8–10.8)

## 2020-03-01 LAB — COMPLETE METABOLIC PANEL WITH GFR
AG Ratio: 1.4 (calc) (ref 1.0–2.5)
ALT: 14 U/L (ref 6–29)
AST: 14 U/L (ref 10–35)
Albumin: 4.3 g/dL (ref 3.6–5.1)
Alkaline phosphatase (APISO): 69 U/L (ref 37–153)
BUN/Creatinine Ratio: 21 (calc) (ref 6–22)
BUN: 25 mg/dL (ref 7–25)
CO2: 26 mmol/L (ref 20–32)
Calcium: 9.6 mg/dL (ref 8.6–10.4)
Chloride: 104 mmol/L (ref 98–110)
Creat: 1.2 mg/dL — ABNORMAL HIGH (ref 0.50–0.99)
GFR, Est African American: 54 mL/min/{1.73_m2} — ABNORMAL LOW (ref >=60)
GFR, Est Non African American: 47 mL/min/{1.73_m2} — ABNORMAL LOW (ref >=60)
Globulin: 3 g/dL (calc) (ref 1.9–3.7)
Glucose, Bld: 64 mg/dL — ABNORMAL LOW (ref 65–99)
Potassium: 4.6 mmol/L (ref 3.5–5.3)
Sodium: 139 mmol/L (ref 135–146)
Total Bilirubin: 0.3 mg/dL (ref 0.2–1.2)
Total Protein: 7.3 g/dL (ref 6.1–8.1)

## 2020-03-01 LAB — LIPID PANEL
Cholesterol: 181 mg/dL (ref <200)
HDL: 58 mg/dL (ref >=50)
LDL Cholesterol (Calc): 104 mg/dL (calc) — ABNORMAL HIGH
Non-HDL Cholesterol (Calc): 123 mg/dL (calc) (ref <130)
Total CHOL/HDL Ratio: 3.1 (calc) (ref <5.0)
Triglycerides: 99 mg/dL (ref <150)

## 2020-03-01 LAB — HEPATITIS C ANTIBODY
Hepatitis C Ab: NONREACTIVE
SIGNAL TO CUT-OFF: 0.18 (ref <1.00)

## 2020-03-03 ENCOUNTER — Other Ambulatory Visit: Payer: Self-pay | Admitting: Family Medicine

## 2020-03-03 DIAGNOSIS — E1165 Type 2 diabetes mellitus with hyperglycemia: Secondary | ICD-10-CM

## 2020-03-03 DIAGNOSIS — E1139 Type 2 diabetes mellitus with other diabetic ophthalmic complication: Secondary | ICD-10-CM

## 2020-03-03 MED ORDER — INSULIN DETEMIR 100 UNIT/ML FLEXPEN: 25.0000 [IU] | PEN_INJECTOR | Freq: Two times a day (BID) | SUBCUTANEOUS | 11 refills | Status: DC

## 2020-03-07 ENCOUNTER — Telehealth: Payer: Self-pay | Admitting: Family Medicine

## 2020-03-07 ENCOUNTER — Other Ambulatory Visit: Payer: Self-pay

## 2020-03-07 MED ORDER — CLONIDINE HCL 0.2 MG PO TABS: 0.2000 mg | ORAL_TABLET | Freq: Two times a day (BID) | ORAL | 0 refills | Status: DC

## 2020-03-07 NOTE — Telephone Encounter (Signed)
Error

## 2020-04-13 ENCOUNTER — Ambulatory Visit (INDEPENDENT_AMBULATORY_CARE_PROVIDER_SITE_OTHER): Payer: Medicare Other

## 2020-04-13 DIAGNOSIS — Z0000 Encounter for general adult medical examination without abnormal findings: Secondary | ICD-10-CM

## 2020-04-13 NOTE — Progress Notes (Signed)
Subjective:   Jill Larsen is a 70 y.o. female who presents for Medicare Annual (Subsequent) preventive examination.  Virtual Visit via Telephone Note  I connected with  Jill Larsen on 04/13/20 at  8:40 AM EST by telephone and verified that I am speaking with the correct person using two identifiers.  Location: Patient: home Provider: [place] Persons participating in the virtual visit: Jill Larsen   I discussed the limitations, risks, security and privacy concerns of performing an evaluation and management service by telephone and the availability of in person appointments. The patient expressed understanding and agreed to proceed.  Interactive audio and video telecommunications were attempted between this nurse and patient, however failed, due to patient having technical difficulties OR patient did not have access to video capability.  We continued and completed visit with audio only.  Some vital signs may be absent or patient reported.   Jill Marker, LPN    Review of Systems     Cardiac Risk Factors include: advanced age (>73men, >43 women);diabetes mellitus;dyslipidemia;hypertension;obesity (BMI >30kg/m2);sedentary lifestyle     Objective:    There were no vitals filed for this visit. There is no height or weight on file to calculate BMI.  Advanced Directives 04/13/2020 04/09/2019 01/21/2019 01/20/2019  Does Patient Have a Medical Advance Directive? No No No No  Would patient like information on creating a medical advance directive? No - Patient declined No - Patient declined No - Patient declined No - Patient declined    Current Medications (verified) Outpatient Encounter Medications as of 04/13/2020  Medication Sig  . amLODipine (NORVASC) 10 MG tablet Take 1 tablet (10 mg total) by mouth daily.  . brimonidine (ALPHAGAN) 0.2 % ophthalmic solution Place 1 drop into the right eye 2 (two) times daily.  . carvedilol (COREG) 12.5 MG tablet Take 1 tablet (12.5 mg  total) by mouth 2 (two) times daily.  . citalopram (CELEXA) 20 MG tablet Take 1 tablet (20 mg total) by mouth daily.  . cloNIDine (CATAPRES) 0.2 MG tablet Take 1 tablet (0.2 mg total) by mouth 2 (two) times daily.  . hydrALAZINE (APRESOLINE) 100 MG tablet Take 1 tablet (100 mg total) by mouth 3 (three) times daily.  . hydrOXYzine (ATARAX/VISTARIL) 10 MG tablet Take 1 tablet (10 mg total) by mouth 3 (three) times daily as needed for anxiety.  . insulin detemir (LEVEMIR) 100 UNIT/ML FlexPen Inject 25-50 Units into the skin 2 (two) times daily. Per instruction from PCP/PA  . Insulin Pen Needle 32G X 4 MM MISC Use as directed with insulin daily  . irbesartan (AVAPRO) 300 MG tablet Take 1 tablet (300 mg total) by mouth daily.  . metFORMIN (GLUCOPHAGE) 1000 MG tablet Take 1 tablet (1,000 mg total) by mouth 2 (two) times daily.  . prednisoLONE acetate (PRED FORTE) 1 % ophthalmic suspension Place 1 drop into the right eye 3 (three) times daily.  . rosuvastatin (CRESTOR) 10 MG tablet Take 1 tablet (10 mg total) by mouth at bedtime.  . blood glucose meter kit and supplies KIT Dispense based on patient and insurance preference. Use up to four times daily for blood sugar monitoring. (FOR Sx IDDM ICD-10-E11.65).  . chlorthalidone (HYGROTON) 25 MG tablet Take 1 tablet (25 mg total) by mouth daily.   No facility-administered encounter medications on file as of 04/13/2020.    Allergies (verified) Patient has no known allergies.   History: Past Medical History:  Diagnosis Date  . Anxiety   . Blind   . Depression   .  Diabetes mellitus without complication ([place])   . Hypertension    Past Surgical History:  Procedure Laterality Date  . EYE SURGERY     Family History  Problem Relation Age of Onset  . Stroke Mother   . Diabetes type II Mother   . Stroke Father   . Diabetes Mellitus II Brother   . Cancer Sister   . Diabetes Son   . Diabetes Mellitus II Brother   . Kidney disease Brother   .  Prostate cancer Brother    Social History   Socioeconomic History  . Marital status: Widowed    Spouse name: Not on file  . Number of children: 3  . Years of education: 27  . Highest education level: Some college, no degree  Occupational History  . Not on file  Tobacco Use  . Smoking status: Never Smoker  . Smokeless tobacco: Never Used  Vaping Use  . Vaping Use: Never used  Substance and Sexual Activity  . Alcohol use: Never  . Drug use: Never  . Sexual activity: Not Currently  Other Topics Concern  . Not on file  Social History Narrative   Pt lives with her daughter Jill Larsen   Does not drive   Social Determinants of Health   Financial Resource Strain: Low Risk   . Difficulty of Paying Living Expenses: Not hard at all  Food Insecurity: No Food Insecurity  . Worried About Charity fundraiser in the Last Year: Never true  . Ran Out of Food in the Last Year: Never true  Transportation Needs: No Transportation Needs  . Lack of Transportation (Medical): No  . Lack of Transportation (Non-Medical): No  Physical Activity: Inactive  . Days of Exercise per Week: 0 days  . Minutes of Exercise per Session: 0 min  Stress: No Stress Concern Present  . Feeling of Stress : Only a little  Social Connections: Socially Isolated  . Frequency of Communication with Friends and Family: More than three times a week  . Frequency of Social Gatherings with Friends and Family: More than three times a week  . Attends Religious Services: Never  . Active Member of Clubs or Organizations: No  . Attends Archivist Meetings: Never  . Marital Status: Widowed    Tobacco Counseling Counseling given: Not Answered   Clinical Intake:  Pre-visit preparation completed: Yes  Pain : No/denies pain     Nutritional Risks: None Diabetes: Yes CBG done?: No Did pt. bring in CBG monitor from home?: No  How often do you need to have someone help you when you read instructions,  pamphlets, or other written materials from your doctor or pharmacy?: 3 - Sometimes  Nutrition Risk Assessment:  Has the patient had any N/V/D within the last 2 months?  No  Does the patient have any non-healing wounds?  No  Has the patient had any unintentional weight loss or weight gain?  No   Diabetes:  Is the patient diabetic?  Yes  If diabetic, was a CBG obtained today?  No  Did the patient bring in their glucometer from home?  No  How often do you monitor your CBG's? 1-2 times per month per patient.   Financial Strains and Diabetes Management:  Are you having any financial strains with the device, your supplies or your medication? No .  Does the patient want to be seen by Chronic Care Management for management of their diabetes?  No  Would the patient like to be  referred to a Nutritionist or for Diabetic Management?  No   Diabetic Exams:  Diabetic Eye Exam: Completed 08/19/19 positive reitnopathy.   Diabetic Foot Exam: Completed 02/29/20.   Interpreter Needed?: No  Information entered by :: Jill Marker LPN   Activities of Daily Living In your present state of health, do you have any difficulty performing the following activities: 04/13/2020 02/29/2020  Hearing? N N  Comment declines hearing aids -  Vision? Y Y  Difficulty concentrating or making decisions? N N  Walking or climbing stairs? Y Y  Dressing or bathing? N N  Doing errands, shopping? Y N  Preparing Food and eating ? N -  Using the Toilet? N -  In the past six months, have you accidently leaked urine? N -  Do you have problems with loss of bowel control? N -  Managing your Medications? N -  Managing your Finances? N -  Housekeeping or managing your Housekeeping? N -  Some recent data might be hidden    Patient Care Team: Delsa Grana, PA-C as PCP - General (Family Medicine) Kate Sable, MD as PCP - Cardiology (Cardiology)  Indicate any recent Medical Services you may have received from other  than [facility] providers in the past year (date may be approximate).     Assessment:   This is a routine wellness examination for [facility].  Hearing/Vision screen  Hearing Screening   '125Hz'$  $Remo'250Hz'jbuPo$'500Hz'$'1000Hz'$'2000Hz'$'3000Hz'$'4000Hz'$'6000Hz'$'8000Hz'$   Right ear:           Left ear:           Comments: Pt denies hearing difficulty  Vision Screening Comments: Annual vision screenings at [place] issues and exercise activities discussed: Current Exercise Habits: The patient does not participate in regular exercise at present, Exercise limited by: None identified  Goals    . Patient Stated     Pt states she would like to take better care of herself, eat a healthier diet and be more compliant with dr appts.       Depression Screen PHQ 2/9 Scores 04/13/2020 02/29/2020 05/31/2019 04/23/2019 04/09/2019 03/17/2019 02/18/2019  PHQ - 2 Score 0 0 0 0 0 0 0  PHQ- 9 Score - - 0 0 - 0 0    Fall Risk Fall Risk  04/13/2020 02/29/2020 05/31/2019 04/23/2019 04/09/2019  Falls in the past year? 0 0 0 0 1  Number falls in past yr: 0 0 0 0 1  Injury with Fall? 0 0 0 0 0  Risk for fall due to : Impaired vision - - - History of fall(s);Impaired vision  Follow up Falls prevention discussed - - - Falls prevention discussed    FALL RISK PREVENTION PERTAINING TO THE HOME:  Any stairs in or around the home? Yes  If so, are there any without handrails? No  Home free of loose throw rugs in walkways, pet beds, electrical cords, etc? Yes  Adequate lighting in your home to reduce risk of falls? Yes   ASSISTIVE DEVICES UTILIZED TO PREVENT FALLS:  Life alert? No  Use of a cane, walker or w/c? No  Grab bars in the bathroom? No  Shower chair or bench in shower? No  Elevated toilet seat or a handicapped toilet? No   TIMED UP AND GO:  Was the test performed? No . Telephonic visit.   Cognitive Function:     6CIT Screen 04/13/2020 04/09/2019  What Year? 0 points 0 points  What month? 0 points 0  points  What time? 0 points 0 points  Count back from 20 0 points 0 points  Months in reverse 0 points 4 points  Repeat phrase 2 points 4 points  Total Score 2 8    Immunizations Immunization History  Administered Date(s) Administered  . PFIZER(Purple Top)SARS-COV-2 Vaccination 06/17/2019, 07/08/2019    TDAP status: Due, Education has been provided regarding the importance of this vaccine. Advised may receive this vaccine at local pharmacy or Health Dept. Aware to provide a copy of the vaccination record if obtained from local pharmacy or Health Dept. Verbalized acceptance and understanding.  Flu Vaccine status: Declined, Education has been provided regarding the importance of this vaccine but patient still declined. Advised may receive this vaccine at local pharmacy or Health Dept. Aware to provide a copy of the vaccination record if obtained from local pharmacy or Health Dept. Verbalized acceptance and understanding.  Pneumococcal vaccine status: Declined,  Education has been provided regarding the importance of this vaccine but patient still declined. Advised may receive this vaccine at local pharmacy or Health Dept. Aware to provide a copy of the vaccination record if obtained from local pharmacy or Health Dept. Verbalized acceptance and understanding.   Covid-19 vaccine status: Completed vaccines  Qualifies for Shingles Vaccine? Yes   Zostavax completed No   Shingrix Completed?: No.    Education has been provided regarding the importance of this vaccine. Patient has been advised to call insurance company to determine out of pocket expense if they have not yet received this vaccine. Advised may also receive vaccine at local pharmacy or Health Dept. Verbalized acceptance and understanding.  Screening Tests Health Maintenance  Topic Date Due  . TETANUS/TDAP  Never done  . MAMMOGRAM  Never done  . DEXA SCAN  Never done  . COVID-19 Vaccine (3 - Booster for Pfizer series) 01/07/2020   . INFLUENZA VACCINE  06/15/2020 (Originally 10/17/2019)  . COLONOSCOPY (Pts 45-60yrs Insurance coverage will need to be confirmed)  02/28/2021 (Originally 01/04/1998)  . PNA vac Low Risk Adult (1 of 2 - PCV13) 04/13/2021 (Originally 01/04/2018)  . OPHTHALMOLOGY EXAM  08/18/2020  . HEMOGLOBIN A1C  08/29/2020  . FOOT EXAM  02/28/2021  . Hepatitis C Screening  Completed    Health Maintenance  Health Maintenance Due  Topic Date Due  . TETANUS/TDAP  Never done  . MAMMOGRAM  Never done  . DEXA SCAN  Never done  . COVID-19 Vaccine (3 - Booster for Pfizer series) 01/07/2020   Colorectal cancer screening: pt declines colonoscopy  Mammogram status: Ordered 02/29/20. Pt provided with contact info and advised to call to schedule appt.   Bone Density status: Ordered 02/29/20. Pt provided with contact info and advised to call to schedule appt.  Lung Cancer Screening: (Low Dose CT Chest recommended if Age 53-80 years, 30 pack-year currently smoking OR have quit w/in 15years.) does not qualify.    Additional Screening:  Hepatitis C Screening: does qualify; Completed 02/29/20  Vision Screening: Recommended annual ophthalmology exams for early detection of glaucoma and other disorders of the eye. Is the patient up to date with their annual eye exam?  Yes  Who is the provider or what is the name of the office in which the patient attends annual eye exams? [place]  Dental Screening: Recommended annual dental exams for proper oral hygiene  Community Resource Referral / Chronic Care Management: CRR required this visit?  No   CCM required this visit?  No      Plan:     I have personally reviewed and noted the following in the patient's chart:   . Medical and social history . Use of alcohol, tobacco or illicit drugs  . Current medications and supplements . Functional ability and status . Nutritional status . Physical activity . Advanced directives . List of other  physicians . Hospitalizations, surgeries, and ER visits in previous 12 months . Vitals . Screenings to include cognitive, depression, and falls . Referrals and appointments  In addition, I have reviewed and discussed with patient certain preventive protocols, quality metrics, and best practice recommendations. A written personalized care plan for preventive services as well as general preventive health recommendations were provided to patient.     Jill Marker, LPN   [id number]   Nurse Notes: pt states she is moving to [place] the end of February with her daughter who will be stationed there with [employer].  Pt encouraged to monitor blood sugar and establish with endocrinology once settled for DM management.Marland Kitchen

## 2020-04-13 NOTE — Patient Instructions (Signed)
Jill Larsen , Thank you for taking time to come for your Medicare Wellness Visit. I appreciate your ongoing commitment to your health goals. Please review the following plan we discussed and let me know if I can assist you in the future.   Screening recommendations/referrals: Colonoscopy: declined Mammogram: Please call [phone] to schedule your mammogram and bone density screening.  Bone Density: due Recommended yearly ophthalmology/optometry visit for glaucoma screening and checkup Recommended yearly dental visit for hygiene and checkup  Vaccinations: Influenza vaccine: declined Pneumococcal vaccine: declined Tdap vaccine: due Shingles vaccine: Shingrix discussed. Please contact your pharmacy for coverage information.  Covid-19: done 06/17/19 & 07/08/19  Advanced directives: Advance directive discussed with you today. Even though you declined this today please call our office should you change your mind and we can give you the proper paperwork for you to fill out.  Conditions/risks identified: Recommend increasing physical activity   Next appointment: Follow up in one year for your annual wellness visit    Preventive Care 65 Years and Older, Female Preventive care refers to lifestyle choices and visits with your health care provider that can promote health and wellness. What does preventive care include?  A yearly physical exam. This is also called an annual well check.  Dental exams once or twice a year.  Routine eye exams. Ask your health care provider how often you should have your eyes checked.  Personal lifestyle choices, including:  Daily care of your teeth and gums.  Regular physical activity.  Eating a healthy diet.  Avoiding tobacco and drug use.  Limiting alcohol use.  Practicing safe sex.  Taking low-dose aspirin every day.  Taking vitamin and mineral supplements as recommended by your health care provider. What happens during an annual well check? The  services and screenings done by your health care provider during your annual well check will depend on your age, overall health, lifestyle risk factors, and family history of disease. Counseling  Your health care provider may ask you questions about your:  Alcohol use.  Tobacco use.  Drug use.  Emotional well-being.  Home and relationship well-being.  Sexual activity.  Eating habits.  History of falls.  Memory and ability to understand (cognition).  Work and work Astronomer.  Reproductive health. Screening  You may have the following tests or measurements:  Height, weight, and BMI.  Blood pressure.  Lipid and cholesterol levels. These may be checked every 5 years, or more frequently if you are over 29 years old.  Skin check.  Lung cancer screening. You may have this screening every year starting at age 55 if you have a 30-pack-year history of smoking and currently smoke or have quit within the past 15 years.  Fecal occult blood test (FOBT) of the stool. You may have this test every year starting at age 63.  Flexible sigmoidoscopy or colonoscopy. You may have a sigmoidoscopy every 5 years or a colonoscopy every 10 years starting at age 89.  Hepatitis C blood test.  Hepatitis B blood test.  Sexually transmitted disease (STD) testing.  Diabetes screening. This is done by checking your blood sugar (glucose) after you have not eaten for a while (fasting). You may have this done every 1-3 years.  Bone density scan. This is done to screen for osteoporosis. You may have this done starting at age 56.  Mammogram. This may be done every 1-2 years. Talk to your health care provider about how often you should have regular mammograms. Talk with your health care  provider about your test results, treatment options, and if necessary, the need for more tests. Vaccines  Your health care provider may recommend certain vaccines, such as:  Influenza vaccine. This is recommended  every year.  Tetanus, diphtheria, and acellular pertussis (Tdap, Td) vaccine. You may need a Td booster every 10 years.  Zoster vaccine. You may need this after age 49.  Pneumococcal 13-valent conjugate (PCV13) vaccine. One dose is recommended after age 61.  Pneumococcal polysaccharide (PPSV23) vaccine. One dose is recommended after age 68. Talk to your health care provider about which screenings and vaccines you need and how often you need them. This information is not intended to replace advice given to you by your health care provider. Make sure you discuss any questions you have with your health care provider. Document Released: 03/31/2015 Document Revised: 11/22/2015 Document Reviewed: 01/03/2015 Elsevier Interactive Patient Education  2017 [employer] Prevention in the Home Falls can cause injuries. They can happen to people of all ages. There are many things you can do to make your home safe and to help prevent falls. What can I do on the outside of my home?  Regularly fix the edges of walkways and driveways and fix any cracks.  Remove anything that might make you trip as you walk through a door, such as a raised step or threshold.  Trim any bushes or trees on the path to your home.  Use bright outdoor lighting.  Clear any walking paths of anything that might make someone trip, such as rocks or tools.  Regularly check to see if handrails are loose or broken. Make sure that both sides of any steps have handrails.  Any raised decks and porches should have guardrails on the edges.  Have any leaves, snow, or ice cleared regularly.  Use sand or salt on walking paths during winter.  Clean up any spills in your garage right away. This includes oil or grease spills. What can I do in the bathroom?  Use night lights.  Install grab bars by the toilet and in the tub and shower. Do not use towel bars as grab bars.  Use non-skid mats or decals in the tub or shower.  If  you need to sit down in the shower, use a plastic, non-slip stool.  Keep the floor dry. Clean up any water that spills on the floor as soon as it happens.  Remove soap buildup in the tub or shower regularly.  Attach bath mats securely with double-sided non-slip rug tape.  Do not have throw rugs and other things on the floor that can make you trip. What can I do in the bedroom?  Use night lights.  Make sure that you have a light by your bed that is easy to reach.  Do not use any sheets or blankets that are too big for your bed. They should not hang down onto the floor.  Have a firm chair that has side arms. You can use this for support while you get dressed.  Do not have throw rugs and other things on the floor that can make you trip. What can I do in the kitchen?  Clean up any spills right away.  Avoid walking on wet floors.  Keep items that you use a lot in easy-to-reach places.  If you need to reach something above you, use a strong step stool that has a grab bar.  Keep electrical cords out of the way.  Do not use floor polish  or wax that makes floors slippery. If you must use wax, use non-skid floor wax.  Do not have throw rugs and other things on the floor that can make you trip. What can I do with my stairs?  Do not leave any items on the stairs.  Make sure that there are handrails on both sides of the stairs and use them. Fix handrails that are broken or loose. Make sure that handrails are as long as the stairways.  Check any carpeting to make sure that it is firmly attached to the stairs. Fix any carpet that is loose or worn.  Avoid having throw rugs at the top or bottom of the stairs. If you do have throw rugs, attach them to the floor with carpet tape.  Make sure that you have a light switch at the top of the stairs and the bottom of the stairs. If you do not have them, ask someone to add them for you. What else can I do to help prevent falls?  Wear shoes  that:  Do not have high heels.  Have rubber bottoms.  Are comfortable and fit you well.  Are closed at the toe. Do not wear sandals.  If you use a stepladder:  Make sure that it is fully opened. Do not climb a closed stepladder.  Make sure that both sides of the stepladder are locked into place.  Ask someone to hold it for you, if possible.  Clearly mark and make sure that you can see:  Any grab bars or handrails.  First and last steps.  Where the edge of each step is.  Use tools that help you move around (mobility aids) if they are needed. These include:  Canes.  Walkers.  Scooters.  Crutches.  Turn on the lights when you go into a dark area. Replace any light bulbs as soon as they burn out.  Set up your furniture so you have a clear path. Avoid moving your furniture around.  If any of your floors are uneven, fix them.  If there are any pets around you, be aware of where they are.  Review your medicines with your doctor. Some medicines can make you feel dizzy. This can increase your chance of falling. Ask your doctor what other things that you can do to help prevent falls. This information is not intended to replace advice given to you by your health care provider. Make sure you discuss any questions you have with your health care provider. Document Released: 12/29/2008 Document Revised: 08/10/2015 Document Reviewed: 04/08/2014 Elsevier Interactive Patient Education  2017 [employer].

## 2020-04-17 DIAGNOSIS — E1142 Type 2 diabetes mellitus with diabetic polyneuropathy: Secondary | ICD-10-CM | POA: Diagnosis not present

## 2020-04-17 DIAGNOSIS — L97521 Non-pressure chronic ulcer of other part of left foot limited to breakdown of skin: Secondary | ICD-10-CM | POA: Diagnosis not present

## 2020-04-17 DIAGNOSIS — B351 Tinea unguium: Secondary | ICD-10-CM | POA: Diagnosis not present

## 2020-04-27 ENCOUNTER — Other Ambulatory Visit: Payer: Self-pay

## 2020-04-27 MED ORDER — CHLORTHALIDONE 25 MG PO TABS: 25.0000 mg | ORAL_TABLET | Freq: Every day | ORAL | 2 refills | Status: DC

## 2020-04-28 ENCOUNTER — Other Ambulatory Visit: Payer: Self-pay | Admitting: Family Medicine

## 2020-04-28 DIAGNOSIS — E1165 Type 2 diabetes mellitus with hyperglycemia: Secondary | ICD-10-CM

## 2020-04-28 DIAGNOSIS — E1139 Type 2 diabetes mellitus with other diabetic ophthalmic complication: Secondary | ICD-10-CM

## 2020-05-04 DIAGNOSIS — E113511 Type 2 diabetes mellitus with proliferative diabetic retinopathy with macular edema, right eye: Secondary | ICD-10-CM | POA: Diagnosis not present

## 2020-05-04 DIAGNOSIS — H3341 Traction detachment of retina, right eye: Secondary | ICD-10-CM | POA: Diagnosis not present

## 2020-05-04 DIAGNOSIS — Z9841 Cataract extraction status, right eye: Secondary | ICD-10-CM | POA: Diagnosis not present

## 2020-05-04 DIAGNOSIS — Z9889 Other specified postprocedural states: Secondary | ICD-10-CM | POA: Diagnosis not present

## 2020-05-04 DIAGNOSIS — H4311 Vitreous hemorrhage, right eye: Secondary | ICD-10-CM | POA: Diagnosis not present

## 2020-05-04 DIAGNOSIS — Z794 Long term (current) use of insulin: Secondary | ICD-10-CM | POA: Diagnosis not present

## 2020-05-04 DIAGNOSIS — H543 Unqualified visual loss, both eyes: Secondary | ICD-10-CM | POA: Diagnosis not present

## 2020-05-04 DIAGNOSIS — Z7984 Long term (current) use of oral hypoglycemic drugs: Secondary | ICD-10-CM | POA: Diagnosis not present

## 2020-05-04 DIAGNOSIS — Z961 Presence of intraocular lens: Secondary | ICD-10-CM | POA: Diagnosis not present

## 2020-05-22 ENCOUNTER — Other Ambulatory Visit: Payer: Self-pay

## 2020-05-22 MED ORDER — CLONIDINE HCL 0.2 MG PO TABS: 0.2000 mg | ORAL_TABLET | Freq: Two times a day (BID) | ORAL | 2 refills | Status: DC

## 2020-06-27 ENCOUNTER — Other Ambulatory Visit: Payer: Medicare Other

## 2020-07-13 ENCOUNTER — Ambulatory Visit
Admission: RE | Admit: 2020-07-13 | Discharge: 2020-07-13 | Disposition: A | Discharge status: Home / Self Care | Payer: Medicare Other | Source: Ambulatory Visit | Attending: Family Medicine | Admitting: Family Medicine

## 2020-07-13 ENCOUNTER — Other Ambulatory Visit: Payer: Self-pay

## 2020-07-13 DIAGNOSIS — Z780 Asymptomatic menopausal state: Secondary | ICD-10-CM | POA: Diagnosis not present

## 2020-07-13 DIAGNOSIS — Z1231 Encounter for screening mammogram for malignant neoplasm of breast: Secondary | ICD-10-CM | POA: Diagnosis not present

## 2020-07-17 DIAGNOSIS — B351 Tinea unguium: Secondary | ICD-10-CM | POA: Diagnosis not present

## 2020-07-17 DIAGNOSIS — E1142 Type 2 diabetes mellitus with diabetic polyneuropathy: Secondary | ICD-10-CM | POA: Diagnosis not present

## 2020-07-24 ENCOUNTER — Telehealth: Payer: Self-pay

## 2020-07-24 DIAGNOSIS — R0989 Other specified symptoms and signs involving the circulatory and respiratory systems: Secondary | ICD-10-CM

## 2020-07-24 DIAGNOSIS — I10 Essential (primary) hypertension: Secondary | ICD-10-CM

## 2020-07-24 MED ORDER — HYDRALAZINE HCL 100 MG PO TABS: 100.0000 mg | ORAL_TABLET | Freq: Three times a day (TID) | ORAL | 3 refills | Status: DC

## 2020-07-24 NOTE — Telephone Encounter (Signed)
Left a detailed message on the patient's voice mail at [phone], is on the [facility]. Told to call back regarding a refill request for Hydralazine 100 mg one tablet three times a day that was received from [facility] on [address]. The patient had mentioned at the last office visit she was planning to move to [place] and we need to confirm if the patient did move and if so, the patient will need to reach out to a physician in [place] to follow her care and refill medications.

## 2020-07-24 NOTE — Telephone Encounter (Signed)
Trey Sailors (daughter) on the patient's DPR called to let our office know the patient has not moved from [place] to [place] as of yet but she will move at the end of July 2022. I explained to the daughter Charolett Bumpers) the patient will need to find a PCP and a Cardiologist in [place] to follow her care while she is living there and they would be the ones to refill all of her medications at that point. The daughter is understanding of the instructions.

## 2020-08-04 ENCOUNTER — Ambulatory Visit (INDEPENDENT_AMBULATORY_CARE_PROVIDER_SITE_OTHER): Payer: Medicare Other | Admitting: Family Medicine

## 2020-08-04 ENCOUNTER — Other Ambulatory Visit: Payer: Self-pay

## 2020-08-04 ENCOUNTER — Encounter: Payer: Self-pay | Admitting: Family Medicine

## 2020-08-04 VITALS — BP 124/68 | HR 91 | Temp 98.5°F | Resp 16 | Ht 63.0 in | Wt 206.7 lb

## 2020-08-04 DIAGNOSIS — I10 Essential (primary) hypertension: Secondary | ICD-10-CM | POA: Diagnosis not present

## 2020-08-04 DIAGNOSIS — F320 Major depressive disorder, single episode, mild: Secondary | ICD-10-CM | POA: Diagnosis not present

## 2020-08-04 DIAGNOSIS — Z794 Long term (current) use of insulin: Secondary | ICD-10-CM

## 2020-08-04 DIAGNOSIS — E113511 Type 2 diabetes mellitus with proliferative diabetic retinopathy with macular edema, right eye: Secondary | ICD-10-CM

## 2020-08-04 DIAGNOSIS — E1165 Type 2 diabetes mellitus with hyperglycemia: Secondary | ICD-10-CM

## 2020-08-04 DIAGNOSIS — Z5181 Encounter for therapeutic drug level monitoring: Secondary | ICD-10-CM | POA: Diagnosis not present

## 2020-08-04 DIAGNOSIS — E785 Hyperlipidemia, unspecified: Secondary | ICD-10-CM | POA: Diagnosis not present

## 2020-08-04 DIAGNOSIS — F329 Major depressive disorder, single episode, unspecified: Secondary | ICD-10-CM

## 2020-08-04 DIAGNOSIS — E11311 Type 2 diabetes mellitus with unspecified diabetic retinopathy with macular edema: Secondary | ICD-10-CM | POA: Diagnosis not present

## 2020-08-04 DIAGNOSIS — IMO0002 Reserved for concepts with insufficient information to code with codable children: Secondary | ICD-10-CM

## 2020-08-04 DIAGNOSIS — N1832 Chronic kidney disease, stage 3b: Secondary | ICD-10-CM | POA: Insufficient documentation

## 2020-08-04 DIAGNOSIS — E1139 Type 2 diabetes mellitus with other diabetic ophthalmic complication: Secondary | ICD-10-CM

## 2020-08-04 DIAGNOSIS — N1831 Chronic kidney disease, stage 3a: Secondary | ICD-10-CM | POA: Diagnosis not present

## 2020-08-04 DIAGNOSIS — E66812 Obesity, class 2: Secondary | ICD-10-CM | POA: Insufficient documentation

## 2020-08-04 DIAGNOSIS — Z6836 Body mass index (BMI) 36.0-36.9, adult: Secondary | ICD-10-CM

## 2020-08-04 DIAGNOSIS — D649 Anemia, unspecified: Secondary | ICD-10-CM | POA: Diagnosis not present

## 2020-08-04 DIAGNOSIS — F419 Anxiety disorder, unspecified: Secondary | ICD-10-CM | POA: Insufficient documentation

## 2020-08-04 MED ORDER — BLOOD GLUCOSE TEST VI STRP: ORAL_STRIP | 11 refills | Status: AC

## 2020-08-04 MED ORDER — INSULIN DETEMIR 100 UNIT/ML FLEXPEN: 25.0000 [IU] | PEN_INJECTOR | Freq: Two times a day (BID) | SUBCUTANEOUS | 4 refills | Status: DC

## 2020-08-04 MED ORDER — INSULIN PEN NEEDLE 32G X 4 MM MISC: 3 refills | Status: AC

## 2020-08-04 MED ORDER — LANCETS MISC: 11 refills | Status: AC

## 2020-08-04 NOTE — Progress Notes (Signed)
Name: Jill Larsen   MRN: [id number]    DOB: 28-Dec-1950   Date:08/04/2020       Progress Note  Chief Complaint  Patient presents with  . Diabetes  . Hypertension  . Hyperlipidemia     Subjective:   Jill Larsen is a 70 y.o. female, presents to clinic for    DM:   Pt managing DM with levemir 25 am and 20 units in the evening - metformin 1000 mg BID Reports good med compliance Pt has no SE from meds. Blood sugars 80-120's some low episodes - denies any high readings Denies: Polyuria, polydipsia, vision changes, neuropathy Recent pertinent labs: Lab Results  Component Value Date   HGBA1C 9.3 (H) 02/29/2020   HGBA1C 7.8 (H) 04/30/2019   HGBA1C 9.5 (H) 01/21/2019   Poor diet, eats candy bars  Standard of care and health maintenance: Foot exam:  UTD DM eye exam:  UTD ACEI/ARB:  yes Statin:  yes Uncontrolled - diet poor   Hypertension:  Labile HTN - difficult to control, on multiple meds Currently managed on hydralazine 100 mg 3 times daily amlodipine 10 mg, irbesartan 300 mg daily, clonidine 0.2 mg twice daily, chlorthalidone 25 mg daily, Coreg 12.5 mg twice daily Pt reports good med compliance and denies any SE.   Blood pressure today is well controlled. BP Readings from Last 3 Encounters:  08/04/20 124/68  02/29/20 132/78  02/24/20 140/70   Pt denies CP, SOB, exertional sx, LE edema, palpitation, Ha's, visual disturbances, lightheadedness, hypotension, syncope.   Hyperlipidemia: Currently treated with rosuvastatin 10 mg, pt reports good med compliance Last Lipids: Lab Results  Component Value Date   CHOL 181 02/29/2020   HDL 58 02/29/2020   LDLCALC 104 (H) 02/29/2020   TRIG 99 02/29/2020   CHOLHDL 3.1 02/29/2020   - Denies: Chest pain, shortness of breath, myalgias, claudication  Anxiety/MDD: Depression screen [facility] 2/9 08/04/2020 04/13/2020 02/29/2020  Decreased Interest 0 0 0  Down, Depressed, Hopeless 0 0 0  PHQ - 2 Score 0 0 0  Altered sleeping 0 - -   Tired, decreased energy 0 - -  Change in appetite 0 - -  Feeling bad or failure about yourself  0 - -  Trouble concentrating 0 - -  Moving slowly or fidgety/restless 0 - -  Suicidal thoughts 0 - -  PHQ-9 Score 0 - -  Difficult doing work/chores Not difficult at all - -   On celexa - doing well, mood and anxiety well controlled    Current Outpatient Medications:  .  amLODipine (NORVASC) 10 MG tablet, Take 1 tablet (10 mg total) by mouth daily., Disp: 90 tablet, Rfl: 3 .  brimonidine (ALPHAGAN) 0.2 % ophthalmic solution, Place 1 drop into the right eye 2 (two) times daily., Disp: , Rfl:  .  citalopram (CELEXA) 20 MG tablet, Take 1 tablet (20 mg total) by mouth daily., Disp: 90 tablet, Rfl: 3 .  cloNIDine (CATAPRES) 0.2 MG tablet, Take 1 tablet (0.2 mg total) by mouth 2 (two) times daily., Disp: 180 tablet, Rfl: 2 .  hydrALAZINE (APRESOLINE) 100 MG tablet, Take 1 tablet (100 mg total) by mouth 3 (three) times daily., Disp: 270 tablet, Rfl: 3 .  hydrOXYzine (ATARAX/VISTARIL) 10 MG tablet, Take 1 tablet (10 mg total) by mouth 3 (three) times daily as needed for anxiety., Disp: 90 tablet, Rfl: 1 .  insulin detemir (LEVEMIR) 100 UNIT/ML FlexPen, Inject 25-50 Units into the skin 2 (two) times daily. Per instruction from PCP/PA,  Disp: 15 mL, Rfl: 11 .  irbesartan (AVAPRO) 300 MG tablet, Take 1 tablet (300 mg total) by mouth daily., Disp: 90 tablet, Rfl: 2 .  metFORMIN (GLUCOPHAGE) 1000 MG tablet, Take 1 tablet (1,000 mg total) by mouth 2 (two) times daily., Disp: 180 tablet, Rfl: 3 .  prednisoLONE acetate (PRED FORTE) 1 % ophthalmic suspension, Place 1 drop into the right eye 3 (three) times daily., Disp: , Rfl:  .  rosuvastatin (CRESTOR) 10 MG tablet, Take 1 tablet (10 mg total) by mouth at bedtime., Disp: 90 tablet, Rfl: 3 .  blood glucose meter kit and supplies KIT, Dispense based on patient and insurance preference. Use up to four times daily for blood sugar monitoring. (FOR Sx IDDM  ICD-10-E11.65). (Patient not taking: Reported on 08/04/2020), Disp: 1 each, Rfl: 0 .  carvedilol (COREG) 12.5 MG tablet, Take 1 tablet (12.5 mg total) by mouth 2 (two) times daily., Disp: 180 tablet, Rfl: 3 .  chlorthalidone (HYGROTON) 25 MG tablet, Take 1 tablet (25 mg total) by mouth daily., Disp: 90 tablet, Rfl: 2 .  Insulin Pen Needle 32G X 4 MM MISC, Use as directed with insulin daily (Patient not taking: Reported on 08/04/2020), Disp: 100 each, Rfl: 3  Patient Active Problem List   Diagnosis Date Noted  . Chronic kidney disease, stage 3b ([place]) 08/04/2020  . Anemia 08/04/2020  . Current mild episode of major depressive disorder ([place]) 08/04/2020  . Anxiety disorder 08/04/2020  . Hyperlipidemia 08/04/2020  . Class 2 severe obesity due to excess calories with serious comorbidity and body mass index (BMI) of 36.0 to 36.9 in adult [facility]) 08/04/2020  . Insulin dependent type 2 diabetes mellitus, uncontrolled ([place]) 04/28/2019  . Labile hypertension 04/28/2019  . Hypertension   . Diabetic macular edema ([place]) 05/21/2018  . Nuclear senile cataract 09/08/2017  . Nuclear sclerosis 05/05/2017  . 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 EYE SURGERY      Family History  Problem Relation Age of Onset  . Stroke Mother   . Diabetes type II Mother   . Stroke Father   . Diabetes Mellitus II Brother   . Cancer Sister   . Breast cancer Sister 25  . Diabetes Son   . Diabetes Mellitus II Brother   . Kidney disease Brother   . Prostate cancer Brother     Social History   Tobacco Use  . Smoking status: Never Smoker  . Smokeless tobacco: Never Used  Vaping Use  . Vaping Use: Never used  Substance Use Topics  . Alcohol use: Never  . Drug use: Never     No Known Allergies  Health Maintenance  Topic Date Due  . COLONOSCOPY (Pts 45-73yr Insurance coverage will need to be  confirmed)  02/28/2021 (Originally 01/04/1998)  . PNA vac Low Risk Adult (1 of 2 - PCV13) 04/13/2021 (Originally 01/04/2018)  . TETANUS/TDAP  08/04/2021 (Originally 01/05/1972)  . OPHTHALMOLOGY EXAM  08/18/2020  . HEMOGLOBIN A1C  08/29/2020  . INFLUENZA VACCINE  10/16/2020  . FOOT EXAM  02/28/2021  . MAMMOGRAM  07/14/2022  . DEXA SCAN  Completed  . COVID-19 Vaccine  Completed  . Hepatitis C Screening  Completed  . HPV VACCINES  Aged Out    Chart Review Today: I personally reviewed active problem list, medication list, allergies, family history, social history, health maintenance, notes from last encounter, lab results, imaging with  the patient/caregiver today.   Review of Systems  Constitutional: Negative.   HENT: Negative.   Eyes: Negative.   Respiratory: Negative.   Cardiovascular: Negative.   Gastrointestinal: Negative.   Endocrine: Negative.   Genitourinary: Negative.   Musculoskeletal: Negative.   Skin: Negative.   Allergic/Immunologic: Negative.   Neurological: Negative.   Hematological: Negative.   Psychiatric/Behavioral: Negative.   All other systems reviewed and are negative.      Objective:   Vitals:   08/04/20 1126  BP: 124/68  Pulse: 91  Resp: 16  Temp: 98.5 F (36.9 C)  TempSrc: Oral  SpO2: 98%  Weight: 206 lb 11.2 oz (93.8 kg)  Height: _0  (1.6 m)    Body mass index is 36.62 kg/m.  Physical Exam Vitals and nursing note reviewed.  Constitutional:      General: She is not in acute distress.    Appearance: Normal appearance. She is well-developed and well-groomed. She is obese. She is not ill-appearing, toxic-appearing or diaphoretic.     Interventions: Face mask in place.     Comments: Pleasant, well appearing, alert  HENT:     Head: Normocephalic and atraumatic.     Right Ear: External ear normal.     Left Ear: External ear normal.  Eyes:     General:        Right eye: No discharge.        Left eye: No discharge.     Comments:  Wearing prescription glasses  Cardiovascular:     Rate and Rhythm: Normal rate and regular rhythm.     Pulses: Normal pulses.     Heart sounds: Normal heart sounds. No murmur heard. No friction rub. No gallop.   Pulmonary:     Effort: Pulmonary effort is normal. No respiratory distress.     Breath sounds: Normal breath sounds. No stridor. No wheezing, rhonchi or rales.  Abdominal:     General: Bowel sounds are normal.     Palpations: Abdomen is soft.     Comments: Obese, soft, NTND, normal BSx4  Musculoskeletal:     Right lower leg: No edema.     Left lower leg: No edema.  Skin:    General: Skin is warm.     Capillary Refill: Capillary refill takes less than 2 seconds.     Coloration: Skin is not jaundiced or pale.     Findings: No lesion or rash.  Neurological:     Mental Status: She is alert. Mental status is at baseline.     Gait: Gait normal.  Psychiatric:        Mood and Affect: Mood normal.        Behavior: Behavior normal. Behavior is cooperative.         Assessment & Plan:     ICD-10-CM   1. Hypertension, unspecified type  Z16 COMPLETE METABOLIC PANEL WITH GFR   On multiple medications, well controlled today, has been very labile, recheck labs  2. Hyperlipidemia, unspecified hyperlipidemia type  R67.8 COMPLETE METABOLIC PANEL WITH GFR   Compliant with statin, no myalgias side effects or concerns, last lipids well controlled, continue Crestor 10 mg daily  3. Insulin dependent type 2 diabetes mellitus, uncontrolled (HCC)  L38.10 COMPLETE METABOLIC PANEL WITH GFR   Z79.4 Hemoglobin A1C    insulin detemir (LEVEMIR) 100 UNIT/ML FlexPen    Insulin Pen Needle 32G X 4 MM MISC    Lancets MISC    Glucose Blood (BLOOD GLUCOSE TEST STRIPS) STRP  levemir managing baseline sugars but last A1C still uncontrolled, work on diet, avoid sugars/sweets, if not improving needs endocrinology consult/mealtime  4. Current mild episode of major depressive disorder, unspecified whether  recurrent (HCC)  F32.0    PHQ-9 reviewed, mood good, continue Celexa  5. Anxiety disorder, unspecified type  F41.9    Same as above  6. Class 2 severe obesity due to excess calories with serious comorbidity and body mass index (BMI) of 36.0 to 36.9 in adult (HCC)  D39.58 COMPLETE METABOLIC PANEL WITH GFR   Z68.36 Hemoglobin A1C   With insulin-dependent uncontrolled diabetes, hypertension, hyperlipidemia and chronic kidney disease stage III  7. Type 2 diabetes mellitus with other ophthalmic complication, with long-term current use of insulin (HCC)  E11.39 Hemoglobin A1C   Z79.4 insulin detemir (LEVEMIR) 100 UNIT/ML FlexPen    Insulin Pen Needle 32G X 4 MM MISC    Lancets MISC    Glucose Blood (BLOOD GLUCOSE TEST STRIPS) STRP  8. Diabetic macular edema (HCC)  E11.311    Patient managing and monitoring with a ophthalmologist and retinal specialist  9. 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 Stage 3a chronic kidney disease (HCC)  G41.71 COMPLETE METABOLIC PANEL WITH GFR   on ARB, encouraged improved diet/diabetes/blood sugar, monitoring  11. Anemia, unspecified type  W78.7 COMPLETE METABOLIC PANEL WITH GFR    Hemoglobin A1C    CBC with Differential/Platelet   Likely of chronic disease/nephropathy, has been stable, recheck  12. Encounter for medication monitoring  Z83.67 COMPLETE METABOLIC PANEL WITH GFR    Hemoglobin A1C    CBC with Differential/Platelet   Refills given on strips, lancets, needles and tried to get her 3 month supply of levemir  Pt moving to [place] this next month - will need to help coordinate some refills locally and she was encouraged to est with PCP ASAP to ensure coordination of care  No follow-ups on file.  - due to pt moving  Delsa Grana, PA-C 08/04/20 11:39 AM

## 2020-08-05 LAB — COMPLETE METABOLIC PANEL WITH GFR
AG Ratio: 1.5 (calc) (ref 1.0–2.5)
ALT: 12 U/L (ref 6–29)
AST: 15 U/L (ref 10–35)
Albumin: 4.4 g/dL (ref 3.6–5.1)
Alkaline phosphatase (APISO): 68 U/L (ref 37–153)
BUN/Creatinine Ratio: 15 (calc) (ref 6–22)
BUN: 23 mg/dL (ref 7–25)
CO2: 27 mmol/L (ref 20–32)
Calcium: 9.9 mg/dL (ref 8.6–10.4)
Chloride: 104 mmol/L (ref 98–110)
Creat: 1.52 mg/dL — ABNORMAL HIGH (ref 0.50–0.99)
GFR, Est African American: 41 mL/min/{1.73_m2} — ABNORMAL LOW (ref >=60)
GFR, Est Non African American: 35 mL/min/{1.73_m2} — ABNORMAL LOW (ref >=60)
Globulin: 3 g/dL (calc) (ref 1.9–3.7)
Glucose, Bld: 170 mg/dL — ABNORMAL HIGH (ref 65–99)
Potassium: 5.2 mmol/L (ref 3.5–5.3)
Sodium: 140 mmol/L (ref 135–146)
Total Bilirubin: 0.5 mg/dL (ref 0.2–1.2)
Total Protein: 7.4 g/dL (ref 6.1–8.1)

## 2020-08-05 LAB — HEMOGLOBIN A1C
Hgb A1c MFr Bld: 9.3 % of total Hgb — ABNORMAL HIGH (ref <5.7)
Mean Plasma Glucose: 220 mg/dL
eAG (mmol/L): 12.2 mmol/L

## 2020-08-05 LAB — CBC WITH DIFFERENTIAL/PLATELET
Absolute Monocytes: 558 cells/uL (ref 200–950)
Basophils Absolute: 62 cells/uL (ref 0–200)
Basophils Relative: 1 %
Eosinophils Absolute: 192 cells/uL (ref 15–500)
Eosinophils Relative: 3.1 %
HCT: 32.4 % — ABNORMAL LOW (ref 35.0–45.0)
Hemoglobin: 10.5 g/dL — ABNORMAL LOW (ref 11.7–15.5)
Lymphs Abs: 1544 cells/uL (ref 850–3900)
MCH: 27.4 pg (ref 27.0–33.0)
MCHC: 32.4 g/dL (ref 32.0–36.0)
MCV: 84.6 fL (ref 80.0–100.0)
MPV: 11.4 fL (ref 7.5–12.5)
Monocytes Relative: 9 %
Neutro Abs: 3844 cells/uL (ref 1500–7800)
Neutrophils Relative %: 62 %
Platelets: 221 10*3/uL (ref 140–400)
RBC: 3.83 10*6/uL (ref 3.80–5.10)
RDW: 13.3 % (ref 11.0–15.0)
Total Lymphocyte: 24.9 %
WBC: 6.2 10*3/uL (ref 3.8–10.8)

## 2020-08-16 ENCOUNTER — Ambulatory Visit: Payer: Self-pay

## 2020-08-16 NOTE — Telephone Encounter (Signed)
Patient called and says her BP is 135/76, Pulse 76. She says she's not dizzy, she has not taken any BP medicines today. I advised I will send this to Dr. Carlynn Purl and someone will call with her recommendation about the medicines to take.

## 2020-08-16 NOTE — Telephone Encounter (Signed)
Patient called and says last night around 2130 her BP was 70/59 and she was dizzy. She says she took all of her BP medicines last night. Today she hasn't taken any medicine, she's not dizzy at this time. She is not able to check her BP due to not being able to see that well. She says her granddaughter is not there at the moment and she checks her BP. I advised to have the granddaughter check the BP when she comes home and to call back with the BP so that we will know what it is today. Patient verbalized understanding. No other symptoms. She says the dizziness has been off and on and getting worse this week. I advised no available appointments in the office this week. She says next week on Monday, Wednesday or Friday is good for her with Leisa. I advised the office is closed for lunch at this time, so once they open someone will call today with an appointment. Also advised another provider will review this information and someone will call back to let her know about her BP medication and if she needs to take it. I advised to call back with the BP. Care advice given, advised ED if SBP below 90 and dizzy like last night, patient verbalized understanding.   Reason for Disposition . Brief (now gone) dizziness or lightheadedness after standing up or eating  Answer Assessment - Initial Assessment Questions 1. BLOOD PRESSURE: "What is the blood pressure?" "Did you take at least two measurements 5 minutes apart?"     70/59 last night 2. ONSET: "When did you take your blood pressure?"     Last night around 9:30p 3. HOW: "How did you obtain the blood pressure?" (e.g., visiting nurse, automatic home BP monitor)     Automatic home BP monitor 4. HISTORY: "Do you have a history of low blood pressure?" "What is your blood pressure normally?"     No, most time it's high 5. MEDICATIONS: "Are you taking any medications for blood pressure?" If Yes, ask: "Have they been changed recently?"     Yes 6. PULSE RATE: "Do you  know what your pulse rate is?"      N/A 7. OTHER SYMPTOMS: "Have you been sick recently?" "Have you had a recent injury?"     Not sick, no injury. Dizziness last night 8. PREGNANCY: "Is there any chance you are pregnant?" "When was your last menstrual period?"     No  Protocols used: BLOOD PRESSURE - LOW-A-AH

## 2020-09-04 DIAGNOSIS — Z961 Presence of intraocular lens: Secondary | ICD-10-CM | POA: Diagnosis not present

## 2020-09-04 DIAGNOSIS — E113511 Type 2 diabetes mellitus with proliferative diabetic retinopathy with macular edema, right eye: Secondary | ICD-10-CM | POA: Diagnosis not present

## 2020-09-04 DIAGNOSIS — Z79899 Other long term (current) drug therapy: Secondary | ICD-10-CM | POA: Diagnosis not present

## 2020-09-04 DIAGNOSIS — Z7984 Long term (current) use of oral hypoglycemic drugs: Secondary | ICD-10-CM | POA: Diagnosis not present

## 2020-09-04 DIAGNOSIS — H4311 Vitreous hemorrhage, right eye: Secondary | ICD-10-CM | POA: Diagnosis not present

## 2020-09-04 DIAGNOSIS — Z8669 Personal history of other diseases of the nervous system and sense organs: Secondary | ICD-10-CM | POA: Diagnosis not present

## 2020-09-04 DIAGNOSIS — Z9889 Other specified postprocedural states: Secondary | ICD-10-CM | POA: Diagnosis not present

## 2020-09-04 DIAGNOSIS — H3341 Traction detachment of retina, right eye: Secondary | ICD-10-CM | POA: Diagnosis not present

## 2020-09-04 DIAGNOSIS — Z794 Long term (current) use of insulin: Secondary | ICD-10-CM | POA: Diagnosis not present

## 2020-12-28 ENCOUNTER — Telehealth: Payer: Self-pay | Admitting: Cardiology

## 2020-12-28 ENCOUNTER — Other Ambulatory Visit: Payer: Self-pay

## 2020-12-28 MED ORDER — IRBESARTAN 300 MG PO TABS: 300.0000 mg | ORAL_TABLET | Freq: Every day | ORAL | 2 refills | Status: DC

## 2020-12-28 NOTE — Telephone Encounter (Signed)
irbesartan (AVAPRO) 300 MG tablet 90 tablet 2 12/28/2020    Sig - Route: Take 1 tablet (300 mg total) by mouth daily. - Oral   Sent to pharmacy as: irbesartan (AVAPRO) 300 MG tablet   E-Prescribing Status: Sent to pharmacy (12/28/2020 12:29 PM EDT)    Pharmacy  [id number] - [place], [place] - [phone]

## 2020-12-28 NOTE — Telephone Encounter (Signed)
*  STAT* If patient is at the pharmacy, call can be transferred to refill team.   1. Which medications need to be refilled? (please list name of each medication and dose if known) Irbesartan 300 mg  2. Which pharmacy/location (including street and city if local pharmacy) is medication to be sent to? [facility] [address]  3. Do they need a 30 day or 90 day supply? 90 day

## 2021-03-14 ENCOUNTER — Telehealth: Payer: Self-pay | Admitting: Family Medicine

## 2021-03-14 DIAGNOSIS — E785 Hyperlipidemia, unspecified: Secondary | ICD-10-CM

## 2021-03-14 DIAGNOSIS — E1165 Type 2 diabetes mellitus with hyperglycemia: Secondary | ICD-10-CM

## 2021-03-14 NOTE — Telephone Encounter (Signed)
Requested medications are due for refill today.  yes  Requested medications are on the active medications list.  yes  Last refill. 02/29/2020  Future visit scheduled.   no  Notes to clinic.  Failed protocol d/t expired labs    Requested Prescriptions  Pending Prescriptions Disp Refills   rosuvastatin (CRESTOR) 10 MG tablet [Pharmacy Med Name: Rosuvastatin Calcium 10 MG Oral Tablet] 90 tablet 0    Sig: TAKE 1 TABLET BY MOUTH AT BEDTIME     Cardiovascular:  Antilipid - Statins Failed - 03/14/2021  7:13 PM      Failed - Total Cholesterol in normal range and within 360 days    Cholesterol  Date Value Ref Range Status  02/29/2020 181 <200 mg/dL Final          Failed - LDL in normal range and within 360 days    LDL Cholesterol (Calc)  Date Value Ref Range Status  02/29/2020 104 (H) mg/dL (calc) Final    Comment:    Reference range: <100 . Desirable range <100 mg/dL for primary prevention;   <70 mg/dL for patients with CHD or diabetic patients  with > or = 2 CHD risk factors. Marland Kitchen LDL-C is now calculated using the Martin-Hopkins  calculation, which is a validated novel method providing  better accuracy than the Friedewald equation in the  estimation of LDL-C.  Horald Pollen et al. Lenox Ahr. 0981;191(47): 2061-2068  (http://education.[facility].com/faq/FAQ164)           Failed - HDL in normal range and within 360 days    HDL  Date Value Ref Range Status  02/29/2020 58 > OR = 50 mg/dL Final          Failed - Triglycerides in normal range and within 360 days    Triglycerides  Date Value Ref Range Status  02/29/2020 99 <150 mg/dL Final          Passed - Patient is not pregnant      Passed - Valid encounter within last 12 months    Recent Outpatient Visits           7 months ago Hypertension, unspecified type   [facility] Danelle Berry, PA-C   1 year ago Uncontrolled type 2 diabetes mellitus with hyperglycemia [facility] [place] [facility])   [id number] Danelle Berry, PA-C   1 year ago Uncontrolled type 2 diabetes mellitus with hyperglycemia [facility])   [facility] Danelle Berry, PA-C   1 year ago Hypertension, unspecified type   [facility] Danelle Berry, PA-C   1 year ago Ulcer of right foot, unspecified ulcer stage [facility])   [facility] Danelle Berry, [place]

## 2021-03-15 ENCOUNTER — Telehealth: Payer: Self-pay | Admitting: Cardiology

## 2021-03-15 ENCOUNTER — Telehealth: Payer: Self-pay

## 2021-03-15 MED ORDER — CHLORTHALIDONE 25 MG PO TABS: 25.0000 mg | ORAL_TABLET | Freq: Every day | ORAL | 0 refills | Status: DC

## 2021-03-15 NOTE — Telephone Encounter (Signed)
Not able to schedule appt due to pt vm not being set up

## 2021-03-15 NOTE — Telephone Encounter (Signed)
Appointment (Newest Message First)  Maple Hudson, RN  You 7 minutes ago (3:34 PM)   She can have 30 day supply with no refills  Needs to establish care in area where she now resides.      You  Cv Div [facility] Triage 9 minutes ago (3:33 PM)   The patient has not been seen since 2021 by Dr. Azucena Cecil and has recently moved to [place]. The patient has not been to a primary care physician either nor been able to establish care in [place] due to Medicaid issue. Please advise if okay to refill the medication.    Thanks, Lindaann Pascal, Morrie Sheldon routed conversation to [employer] Refills 1 hour ago (1:57 PM)   Location manager, Morrie Sheldon 1 hour ago (1:57 PM)   AG Patient has moved to [place] and is trying to find a cardiologist there       Note

## 2021-03-15 NOTE — Telephone Encounter (Signed)
Fax received from [facility] in [place], [place] for refill of chlorthalidone. Patient last seen 02/24/2020 with F/U PRN advised. Spoke with patient's daughter Charolett Bumpers to schedule F/U appointment. Daughter states patient has moved with her to [place] but they never established care with another cardiologist. Advised pharmacy to contact PCP for refill.

## 2021-03-15 NOTE — Telephone Encounter (Signed)
Pt due for f/u

## 2021-03-15 NOTE — Telephone Encounter (Signed)
-----   Message from Andi Devon sent at 03/15/2021  9:41 AM EST ----- Regarding: appt Please schedule appointment for refills or have patient get refills from PCP if does not want to return to our office (PRN F/U). Thank you!

## 2021-03-15 NOTE — Telephone Encounter (Signed)
Patient has moved to [place] and is trying to find a cardiologist there

## 2021-04-18 ENCOUNTER — Telehealth: Payer: Self-pay | Admitting: Family Medicine

## 2021-04-18 NOTE — Telephone Encounter (Signed)
Copied from CRM [id number]. Topic: Medicare AWV >> Apr 18, 2021 10:33 AM Claudette Laws R wrote: Reason for CRM:  Left message for patient to call back and schedule Medicare Annual Wellness Visit (AWV) in office.   If unable to come into the office for AWV,  please offer to do virtually or by telephone.  Last AWV:  04/13/2020  Please schedule at anytime with [facility] Health Advisor.  40 minute appointment  Any questions, please contact me at [phone]

## 2021-04-24 LAB — MICROALBUMIN / CREATININE URINE RATIO: Microalb Creat Ratio: 90.6

## 2021-04-24 LAB — PROTEIN / CREATININE RATIO, URINE
Albumin, U: 31
Creatinine, Urine: 34.2

## 2021-05-07 LAB — HM DIABETES EYE EXAM

## 2021-06-04 ENCOUNTER — Other Ambulatory Visit: Payer: Self-pay | Admitting: Family Medicine

## 2021-06-04 DIAGNOSIS — I10 Essential (primary) hypertension: Secondary | ICD-10-CM

## 2021-06-04 DIAGNOSIS — R0989 Other specified symptoms and signs involving the circulatory and respiratory systems: Secondary | ICD-10-CM

## 2021-06-12 ENCOUNTER — Other Ambulatory Visit: Payer: Self-pay | Admitting: Family Medicine

## 2021-06-12 DIAGNOSIS — E1165 Type 2 diabetes mellitus with hyperglycemia: Secondary | ICD-10-CM

## 2021-06-12 DIAGNOSIS — E785 Hyperlipidemia, unspecified: Secondary | ICD-10-CM

## 2021-06-13 NOTE — Telephone Encounter (Signed)
Pt was moving to [place] as I seen on your last visit note. Pt was given 90 day supply on December. Would you want me to refill? ?

## 2021-06-15 ENCOUNTER — Other Ambulatory Visit: Payer: Self-pay

## 2021-06-15 DIAGNOSIS — E1165 Type 2 diabetes mellitus with hyperglycemia: Secondary | ICD-10-CM

## 2021-06-15 DIAGNOSIS — E785 Hyperlipidemia, unspecified: Secondary | ICD-10-CM

## 2021-06-15 MED ORDER — ROSUVASTATIN CALCIUM 10 MG PO TABS: 10.0000 mg | ORAL_TABLET | Freq: Every day | ORAL | 0 refills | Status: DC

## 2021-07-16 LAB — COMPREHENSIVE METABOLIC PANEL
Albumin: 4.6 (ref 3.5–5.0)
Calcium: 10.3 (ref 8.7–10.7)
Globulin: 3
eGFR: 30

## 2021-07-16 LAB — HEPATIC FUNCTION PANEL
ALT: 8 U/L (ref 7–35)
AST: 11 — AB (ref 13–35)
Alkaline Phosphatase: 83 (ref 25–125)
Bilirubin, Total: 0.2

## 2021-07-16 LAB — BASIC METABOLIC PANEL
BUN: 33 — AB (ref 4–21)
CO2: 25 — AB (ref 13–22)
Chloride: 100 (ref 99–108)
Creatinine: 1.8 — AB (ref 0.5–1.1)
Glucose: 142
Sodium: 139 (ref 137–147)

## 2021-07-16 LAB — HEMOGLOBIN A1C: Hemoglobin A1C: 9.2

## 2021-07-16 LAB — TSH: TSH: 4.67 (ref 0.41–5.90)

## 2021-08-08 LAB — HM MAMMOGRAPHY

## 2021-08-27 ENCOUNTER — Other Ambulatory Visit: Payer: Self-pay | Admitting: Family Medicine

## 2021-08-27 DIAGNOSIS — E1165 Type 2 diabetes mellitus with hyperglycemia: Secondary | ICD-10-CM

## 2021-08-27 DIAGNOSIS — E785 Hyperlipidemia, unspecified: Secondary | ICD-10-CM

## 2021-08-28 NOTE — Telephone Encounter (Signed)
Pt needs an appt for further  refills 

## 2021-09-24 ENCOUNTER — Other Ambulatory Visit: Payer: Self-pay | Admitting: Family Medicine

## 2021-09-24 DIAGNOSIS — E785 Hyperlipidemia, unspecified: Secondary | ICD-10-CM

## 2021-09-24 DIAGNOSIS — E1165 Type 2 diabetes mellitus with hyperglycemia: Secondary | ICD-10-CM

## 2022-01-31 NOTE — Progress Notes (Unsigned)
Name: Jill Larsen   MRN: [id number]    DOB: [birth year]   Date:02/01/2022       Progress Note  Chief Complaint  Patient presents with   Follow-up   Diabetes   Hyperlipidemia   Anxiety     Subjective:   Jill Larsen is a 71 y.o. female, presents to clinic for reestablishing care after moving to [facility] June 2022 to June 2023  HTN - hx of being difficult to control BP Readings from Last 3 Encounters:  02/01/22 (!) 170/74  08/04/20 124/68  02/29/20 132/78  She is out of hydralazine   Previously on celexa, but doesn't look like she is still taking it, recent death in the family   IDDM -  Doesn't know her last sugars, meter is packed and unavailable Giving insulin 25 units in am 20 units in pm    ***   Current Outpatient Medications:    amLODipine (NORVASC) 10 MG tablet, Take 1 tablet (10 mg total) by mouth daily., Disp: 90 tablet, Rfl: 3   brimonidine (ALPHAGAN) 0.2 % ophthalmic solution, Place 1 drop into the right eye 2 (two) times daily., Disp: , Rfl:    citalopram (CELEXA) 20 MG tablet, Take 1 tablet (20 mg total) by mouth daily., Disp: 90 tablet, Rfl: 3   cloNIDine (CATAPRES) 0.2 MG tablet, Take 1 tablet (0.2 mg total) by mouth 2 (two) times daily., Disp: 180 tablet, Rfl: 2   famotidine (PEPCID) 40 MG tablet, Take 40 mg by mouth 2 (two) times daily., Disp: , Rfl:    fluticasone (FLONASE) 50 MCG/ACT nasal spray, Place 1 spray into both nostrils daily., Disp: , Rfl:    Glucose Blood (BLOOD GLUCOSE TEST STRIPS) STRP, Use as directed to monitor FSBS at least once daily. Dx: E11.65., Disp: 100 strip, Rfl: 11   hydrALAZINE (APRESOLINE) 100 MG tablet, Take 1 tablet (100 mg total) by mouth 3 (three) times daily., Disp: 270 tablet, Rfl: 3   hydrOXYzine (ATARAX/VISTARIL) 10 MG tablet, Take 1 tablet (10 mg total) by mouth 3 (three) times daily as needed for anxiety., Disp: 90 tablet, Rfl: 1   insulin detemir (LEVEMIR) 100 UNIT/ML FlexPen, Inject 25-50 Units into the skin 2 (two)  times daily. Per instruction from PCP/PA, Disp: 45 mL, Rfl: 4   Insulin Pen Needle 32G X 4 MM MISC, Use as directed with insulin BID, Disp: 200 each, Rfl: 3   irbesartan (AVAPRO) 300 MG tablet, Take 1 tablet (300 mg total) by mouth daily., Disp: 90 tablet, Rfl: 2   Lancets MISC, Use as directed to monitor FSBS at least once daily for IDDM, uncontrolled. Dx: E11.65., Disp: 200 each, Rfl: 11   metFORMIN (GLUCOPHAGE) 1000 MG tablet, Take 1 tablet (1,000 mg total) by mouth 2 (two) times daily., Disp: 180 tablet, Rfl: 3   prednisoLONE acetate (PRED FORTE) 1 % ophthalmic suspension, Place 1 drop into the right eye 3 (three) times daily., Disp: , Rfl:    rosuvastatin (CRESTOR) 10 MG tablet, TAKE 1 TABLET BY MOUTH AT BEDTIME, Disp: 30 tablet, Rfl: 0   carvedilol (COREG) 12.5 MG tablet, Take 1 tablet (12.5 mg total) by mouth 2 (two) times daily., Disp: 180 tablet, Rfl: 3   chlorthalidone (HYGROTON) 25 MG tablet, Take 1 tablet (25 mg total) by mouth daily., Disp: 30 tablet, Rfl: 0  Patient Active Problem List   Diagnosis Date Noted   Chronic kidney disease, stage 3b (HCC) 08/04/2020   Anemia 08/04/2020   Current mild episode of major  depressive disorder (HCC) 08/04/2020   Anxiety disorder 08/04/2020   Hyperlipidemia 08/04/2020   Class 2 severe obesity due to excess calories with serious comorbidity and body mass index (BMI) of 36.0 to 36.9 in adult [facility]) 08/04/2020   Uncontrolled diabetes mellitus with hyperglycemia, with long-term current use of insulin (HCC) 04/28/2019   Labile hypertension 04/28/2019   Hypertension    Diabetic macular edema (HCC) 05/21/2018   Nuclear senile cataract 09/08/2017   Nuclear sclerosis 05/05/2017   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) 06/25/2016    Past Surgical History:  Procedure Laterality Date   EYE SURGERY      Family History  Problem Relation Age of Onset   Stroke Mother    Diabetes type II Mother    Stroke  Father    Diabetes Mellitus II Brother    Cancer Sister    Breast cancer Sister 49   Diabetes Son    Diabetes Mellitus II Brother    Kidney disease Brother    Prostate cancer Brother     Social History   Tobacco Use   Smoking status: Never   Smokeless tobacco: Never  Vaping Use   Vaping Use: Never used  Substance Use Topics   Alcohol use: Never   Drug use: Never     No Known Allergies  Health Maintenance  Topic Date Due   Diabetic kidney evaluation - Urine ACR  Never done   HEMOGLOBIN A1C  02/04/2021   FOOT EXAM  02/28/2021   Medicare Annual Wellness (AWV)  04/13/2021   MAMMOGRAM  07/13/2021   Diabetic kidney evaluation - GFR measurement  08/04/2021   OPHTHALMOLOGY EXAM  02/01/2022 (Originally 08/18/2020)   COVID-19 Vaccine (4 - Pfizer risk series) 02/17/2022 (Originally 06/28/2020)   Zoster Vaccines- Shingrix (1 of 2) 05/04/2022 (Originally 01/05/1970)   INFLUENZA VACCINE  06/16/2022 (Originally 10/16/2021)   Pneumonia Vaccine 29+ Years old (1 - PCV) 02/02/2023 (Originally 01/04/2018)   COLONOSCOPY (Pts 45-43yrs Insurance coverage will need to be confirmed)  02/02/2023 (Originally 01/04/1998)   DEXA SCAN  Completed   Hepatitis C Screening  Completed   HPV VACCINES  Aged Out    Chart Review Today: ***  Review of Systems   Objective:   Vitals:   02/01/22 1513  BP: (!) 170/74  Pulse: 90  Resp: 16  Temp: 98.1 F (36.7 C)  TempSrc: Oral  SpO2: 98%  Weight: 200 lb 1.6 oz (90.8 kg)  Height: 5\' 3"  (1.6 m)    Body mass index is 35.45 kg/m.  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)     Genitourinary   Chronic kidney disease, stage 3b (HCC)     Other   Anemia   Relevant Orders   CBC with Differential/Platelet   Current mild episode of major depressive disorder (HCC)   Anxiety disorder   Hyperlipidemia   Relevant  Orders   COMPLETE METABOLIC PANEL WITH GFR   Lipid panel   Class 2 severe obesity due to excess calories with serious comorbidity and body mass index (BMI) of 36.0 to 36.9 in adult [facility])   Relevant Orders   COMPLETE METABOLIC PANEL WITH GFR   CBC with Differential/Platelet   Lipid panel   Hemoglobin A1c   Other Visit Diagnoses     Annual physical exam       Relevant Orders   COMPLETE METABOLIC PANEL WITH GFR   CBC with Differential/Platelet   Lipid panel   Need for influenza vaccination       Need for pneumococcal vaccination       Need for shingles vaccine       Postmenopausal estrogen deficiency       Encounter for screening mammogram for malignant neoplasm of breast       Screening for malignant neoplasm of colon       Relevant Orders   Fecal Globin By Immunochemistry        No follow-ups on file.   Danelle Berry, PA-C 02/01/22 3:35 PM

## 2022-01-31 NOTE — Patient Instructions (Signed)
Preventive Care 65 Years and Older, Female Preventive care refers to lifestyle choices and visits with your health care provider that can promote health and wellness. Preventive care visits are also called wellness exams. What can I expect for my preventive care visit? Counseling Your health care provider may ask you questions about your: Medical history, including: Past medical problems. Family medical history. Pregnancy and menstrual history. History of falls. Current health, including: Memory and ability to understand (cognition). Emotional well-being. Home life and relationship well-being. Sexual activity and sexual health. Lifestyle, including: Alcohol, nicotine or tobacco, and drug use. Access to firearms. Diet, exercise, and sleep habits. Work and work environment. Sunscreen use. Safety issues such as seatbelt and bike helmet use. Physical exam Your health care provider will check your: Height and weight. These may be used to calculate your BMI (body mass index). BMI is a measurement that tells if you are at a healthy weight. Waist circumference. This measures the distance around your waistline. This measurement also tells if you are at a healthy weight and may help predict your risk of certain diseases, such as type 2 diabetes and high blood pressure. Heart rate and blood pressure. Body temperature. Skin for abnormal spots. What immunizations do I need?  Vaccines are usually given at various ages, according to a schedule. Your health care provider will recommend vaccines for you based on your age, medical history, and lifestyle or other factors, such as travel or where you work. What tests do I need? Screening Your health care provider may recommend screening tests for certain conditions. This may include: Lipid and cholesterol levels. Hepatitis C test. Hepatitis B test. HIV (human immunodeficiency virus) test. STI (sexually transmitted infection) testing, if you are at  risk. Lung cancer screening. Colorectal cancer screening. Diabetes screening. This is done by checking your blood sugar (glucose) after you have not eaten for a while (fasting). Mammogram. Talk with your health care provider about how often you should have regular mammograms. BRCA-related cancer screening. This may be done if you have a family history of breast, ovarian, tubal, or peritoneal cancers. Bone density scan. This is done to screen for osteoporosis. Talk with your health care provider about your test results, treatment options, and if necessary, the need for more tests. Follow these instructions at home: Eating and drinking  Eat a diet that includes fresh fruits and vegetables, whole grains, lean protein, and low-fat dairy products. Limit your intake of foods with high amounts of sugar, saturated fats, and salt. Take vitamin and mineral supplements as recommended by your health care provider. Do not drink alcohol if your health care provider tells you not to drink. If you drink alcohol: Limit how much you have to 0-1 drink a day. Know how much alcohol is in your drink. In the [place] one drink equals one 12 oz bottle of beer (355 mL), one 5 oz glass of wine (148 mL), or one 1 oz glass of hard liquor (44 mL). Lifestyle Brush your teeth every morning and night with fluoride toothpaste. Floss one time each day. Exercise for at least 30 minutes 5 or more days each week. Do not use any products that contain nicotine or tobacco. These products include cigarettes, chewing tobacco, and vaping devices, such as e-cigarettes. If you need help quitting, ask your health care provider. Do not use drugs. If you are sexually active, practice safe sex. Use a condom or other form of protection in order to prevent STIs. Take aspirin only as told by   your health care provider. Make sure that you understand how much to take and what form to take. Work with your health care provider to find out whether it  is safe and beneficial for you to take aspirin daily. Ask your health care provider if you need to take a cholesterol-lowering medicine (statin). Find healthy ways to manage stress, such as: Meditation, yoga, or listening to music. Journaling. Talking to a trusted person. Spending time with friends and family. Minimize exposure to UV radiation to reduce your risk of skin cancer. Safety Always wear your seat belt while driving or riding in a vehicle. Do not drive: If you have been drinking alcohol. Do not ride with someone who has been drinking. When you are tired or distracted. While texting. If you have been using any mind-altering substances or drugs. Wear a helmet and other protective equipment during sports activities. If you have firearms in your house, make sure you follow all gun safety procedures. What's next? Visit your health care provider once a year for an annual wellness visit. Ask your health care provider how often you should have your eyes and teeth checked. Stay up to date on all vaccines. This information is not intended to replace advice given to you by your health care provider. Make sure you discuss any questions you have with your health care provider. Document Revised: 08/30/2020 Document Reviewed: 08/30/2020 Elsevier Patient Education  2023 Elsevier Inc.  

## 2022-02-01 ENCOUNTER — Encounter: Payer: Self-pay | Admitting: Family Medicine

## 2022-02-01 ENCOUNTER — Ambulatory Visit (INDEPENDENT_AMBULATORY_CARE_PROVIDER_SITE_OTHER): Payer: Medicare Other | Admitting: Family Medicine

## 2022-02-01 VITALS — BP 170/74 | HR 90 | Temp 98.1°F | Resp 16 | Ht 63.0 in | Wt 200.1 lb

## 2022-02-01 DIAGNOSIS — E782 Mixed hyperlipidemia: Secondary | ICD-10-CM | POA: Diagnosis not present

## 2022-02-01 DIAGNOSIS — Z6835 Body mass index (BMI) 35.0-35.9, adult: Secondary | ICD-10-CM

## 2022-02-01 DIAGNOSIS — N1832 Chronic kidney disease, stage 3b: Secondary | ICD-10-CM

## 2022-02-01 DIAGNOSIS — F419 Anxiety disorder, unspecified: Secondary | ICD-10-CM

## 2022-02-01 DIAGNOSIS — E11311 Type 2 diabetes mellitus with unspecified diabetic retinopathy with macular edema: Secondary | ICD-10-CM

## 2022-02-01 DIAGNOSIS — Z794 Long term (current) use of insulin: Secondary | ICD-10-CM

## 2022-02-01 DIAGNOSIS — Z1231 Encounter for screening mammogram for malignant neoplasm of breast: Secondary | ICD-10-CM

## 2022-02-01 DIAGNOSIS — F4321 Adjustment disorder with depressed mood: Secondary | ICD-10-CM

## 2022-02-01 DIAGNOSIS — E113519 Type 2 diabetes mellitus with proliferative diabetic retinopathy with macular edema, unspecified eye: Secondary | ICD-10-CM | POA: Diagnosis not present

## 2022-02-01 DIAGNOSIS — Z780 Asymptomatic menopausal state: Secondary | ICD-10-CM

## 2022-02-01 DIAGNOSIS — R0989 Other specified symptoms and signs involving the circulatory and respiratory systems: Secondary | ICD-10-CM

## 2022-02-01 DIAGNOSIS — F320 Major depressive disorder, single episode, mild: Secondary | ICD-10-CM

## 2022-02-01 DIAGNOSIS — D649 Anemia, unspecified: Secondary | ICD-10-CM

## 2022-02-01 DIAGNOSIS — F329 Major depressive disorder, single episode, unspecified: Secondary | ICD-10-CM

## 2022-02-01 DIAGNOSIS — E1165 Type 2 diabetes mellitus with hyperglycemia: Secondary | ICD-10-CM

## 2022-02-01 DIAGNOSIS — Z1211 Encounter for screening for malignant neoplasm of colon: Secondary | ICD-10-CM

## 2022-02-01 DIAGNOSIS — Z23 Encounter for immunization: Secondary | ICD-10-CM

## 2022-02-01 DIAGNOSIS — Z0000 Encounter for general adult medical examination without abnormal findings: Secondary | ICD-10-CM

## 2022-02-01 DIAGNOSIS — F339 Major depressive disorder, recurrent, unspecified: Secondary | ICD-10-CM

## 2022-02-01 DIAGNOSIS — I10 Essential (primary) hypertension: Secondary | ICD-10-CM

## 2022-02-01 MED ORDER — CITALOPRAM HYDROBROMIDE 20 MG PO TABS: 20.0000 mg | ORAL_TABLET | Freq: Every day | ORAL | 3 refills | Status: AC

## 2022-02-01 MED ORDER — HYDRALAZINE HCL 100 MG PO TABS: 100.0000 mg | ORAL_TABLET | Freq: Three times a day (TID) | ORAL | 1 refills | Status: AC

## 2022-02-01 MED ORDER — BLOOD GLUCOSE MONITOR KIT: PACK | 0 refills | Status: AC

## 2022-02-02 LAB — CBC WITH DIFFERENTIAL/PLATELET
Absolute Monocytes: 745 cells/uL (ref 200–950)
Basophils Absolute: 73 cells/uL (ref 0–200)
Basophils Relative: 1 %
Eosinophils Absolute: 161 cells/uL (ref 15–500)
Eosinophils Relative: 2.2 %
HCT: 32.3 % — ABNORMAL LOW (ref 35.0–45.0)
Hemoglobin: 10.6 g/dL — ABNORMAL LOW (ref 11.7–15.5)
Lymphs Abs: 2424 cells/uL (ref 850–3900)
MCH: 26.4 pg — ABNORMAL LOW (ref 27.0–33.0)
MCHC: 32.8 g/dL (ref 32.0–36.0)
MCV: 80.3 fL (ref 80.0–100.0)
MPV: 11.5 fL (ref 7.5–12.5)
Monocytes Relative: 10.2 %
Neutro Abs: 3898 cells/uL (ref 1500–7800)
Neutrophils Relative %: 53.4 %
Platelets: 266 10*3/uL (ref 140–400)
RBC: 4.02 10*6/uL (ref 3.80–5.10)
RDW: 13.2 % (ref 11.0–15.0)
Total Lymphocyte: 33.2 %
WBC: 7.3 10*3/uL (ref 3.8–10.8)

## 2022-02-02 LAB — MICROALBUMIN / CREATININE URINE RATIO
Creatinine, Urine: 138 mg/dL (ref 20–275)
Microalb Creat Ratio: 28 mcg/mg creat (ref <30)
Microalb, Ur: 3.8 mg/dL

## 2022-02-02 LAB — LIPID PANEL
Cholesterol: 117 mg/dL (ref <200)
HDL: 57 mg/dL (ref >=50)
LDL Cholesterol (Calc): 44 mg/dL (calc)
Non-HDL Cholesterol (Calc): 60 mg/dL (calc) (ref <130)
Total CHOL/HDL Ratio: 2.1 (calc) (ref <5.0)
Triglycerides: 84 mg/dL (ref <150)

## 2022-02-02 LAB — COMPLETE METABOLIC PANEL WITH GFR
AG Ratio: 1.3 (calc) (ref 1.0–2.5)
ALT: 7 U/L (ref 6–29)
AST: 9 U/L — ABNORMAL LOW (ref 10–35)
Albumin: 4.3 g/dL (ref 3.6–5.1)
Alkaline phosphatase (APISO): 64 U/L (ref 37–153)
BUN/Creatinine Ratio: 20 (calc) (ref 6–22)
BUN: 32 mg/dL — ABNORMAL HIGH (ref 7–25)
CO2: 27 mmol/L (ref 20–32)
Calcium: 9.5 mg/dL (ref 8.6–10.4)
Chloride: 102 mmol/L (ref 98–110)
Creat: 1.6 mg/dL — ABNORMAL HIGH (ref 0.50–1.05)
Globulin: 3.2 g/dL (calc) (ref 1.9–3.7)
Glucose, Bld: 150 mg/dL — ABNORMAL HIGH (ref 65–99)
Potassium: 4.3 mmol/L (ref 3.5–5.3)
Sodium: 139 mmol/L (ref 135–146)
Total Bilirubin: 0.4 mg/dL (ref 0.2–1.2)
Total Protein: 7.5 g/dL (ref 6.1–8.1)
eGFR: 35 mL/min/{1.73_m2} — ABNORMAL LOW (ref >=60)

## 2022-02-02 LAB — HEMOGLOBIN A1C
Hgb A1c MFr Bld: 9 % of total Hgb — ABNORMAL HIGH (ref <5.7)
Mean Plasma Glucose: 212 mg/dL
eAG (mmol/L): 11.7 mmol/L

## 2022-02-06 ENCOUNTER — Encounter: Payer: Self-pay | Admitting: Family Medicine

## 2022-02-06 ENCOUNTER — Telehealth: Payer: Self-pay | Admitting: Family Medicine

## 2022-02-06 DIAGNOSIS — F329 Major depressive disorder, single episode, unspecified: Secondary | ICD-10-CM | POA: Insufficient documentation

## 2022-02-06 DIAGNOSIS — R0989 Other specified symptoms and signs involving the circulatory and respiratory systems: Secondary | ICD-10-CM

## 2022-02-06 DIAGNOSIS — E1165 Type 2 diabetes mellitus with hyperglycemia: Secondary | ICD-10-CM

## 2022-02-06 DIAGNOSIS — E785 Hyperlipidemia, unspecified: Secondary | ICD-10-CM

## 2022-02-06 DIAGNOSIS — I10 Essential (primary) hypertension: Secondary | ICD-10-CM

## 2022-02-06 MED ORDER — CHLORTHALIDONE 25 MG PO TABS: 25.0000 mg | ORAL_TABLET | Freq: Every day | ORAL | 1 refills | Status: AC

## 2022-02-06 MED ORDER — FREESTYLE LIBRE 3 SENSOR MISC: 1.0000 | 2 refills | Status: DC

## 2022-02-06 MED ORDER — CLONIDINE HCL 0.2 MG PO TABS: 0.2000 mg | ORAL_TABLET | Freq: Two times a day (BID) | ORAL | 1 refills | Status: AC

## 2022-02-06 MED ORDER — AMLODIPINE BESYLATE 10 MG PO TABS: 10.0000 mg | ORAL_TABLET | Freq: Every day | ORAL | 1 refills | Status: AC

## 2022-02-06 MED ORDER — CARVEDILOL 12.5 MG PO TABS: 12.5000 mg | ORAL_TABLET | Freq: Two times a day (BID) | ORAL | 1 refills | Status: AC

## 2022-02-06 MED ORDER — INSULIN DETEMIR 100 UNIT/ML FLEXPEN: 20.0000 [IU] | PEN_INJECTOR | Freq: Two times a day (BID) | SUBCUTANEOUS | 0 refills | Status: AC

## 2022-02-06 MED ORDER — METFORMIN HCL 1000 MG PO TABS: 1000.0000 mg | ORAL_TABLET | Freq: Two times a day (BID) | ORAL | 1 refills | Status: AC

## 2022-02-06 MED ORDER — ROSUVASTATIN CALCIUM 10 MG PO TABS: 10.0000 mg | ORAL_TABLET | Freq: Every day | ORAL | 3 refills | Status: DC

## 2022-02-06 MED ORDER — IRBESARTAN 300 MG PO TABS: 300.0000 mg | ORAL_TABLET | Freq: Every day | ORAL | 1 refills | Status: AC

## 2022-02-06 NOTE — Assessment & Plan Note (Signed)
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

## 2022-02-06 NOTE — Assessment & Plan Note (Signed)
Blood pressure is uncontrolled today but she does not have her hydralazine 100 mg 3 times daily We reviewed her medications which have been recently prescribed by the PCP from [place] and filled at a local [facility] she does have and is taking carvedilol 12.5 twice daily, clonidine 0.2 mg BID, irbesartan 300 mg once daily chlorthalidone 25 mg daily and amlodipine 10 mg daily BP will need to be checked again after adding back hydralazine She is currently asx She does unfortunately have chronic kidney disease and labs will need to be rechecked and she probably needs to follow-up with cardiology and nephrology BP Readings from Last 3 Encounters:  02/01/22 (!) 170/74  08/04/20 124/68  02/29/20 132/78

## 2022-02-06 NOTE — Assessment & Plan Note (Signed)
She is maxed out on ARB and has amlodipine on board, need to improve glycemic and blood pressure control She did establish with nephrology in [place] we will need to get her established locally Consider Farxiga/Jardiance?

## 2022-02-06 NOTE — Assessment & Plan Note (Signed)
Same as MDD - see above and plan    02/01/2022    3:09 PM 08/04/2020   11:29 AM  GAD 7 : Generalized Anxiety Score  Nervous, Anxious, on Edge 1 0  Control/stop worrying 1 0  Worry too much - different things 1 0  Trouble relaxing 1 0  Restless 1 0  Easily annoyed or irritable 0 0  Afraid - awful might happen 0 0  Total GAD 7 Score 5 0  Anxiety Difficulty Somewhat difficult Not difficult at all  GAD 7 reviewed and mildly positive

## 2022-02-06 NOTE — Assessment & Plan Note (Signed)
History of uncontrolled insulin-dependent diabetes, patient prior to moving out of the area, did not titrate her insulin or check her blood sugars regularly and it was difficult to help her get her A1c at goal She reports taking the same dose of insulin she did a few years ago, she cannot recall what her last A1c was in [place], she does not currently have a glucometer and is not checking blood sugars daily Diabetic foot exam was done today, all labs will need to be obtained and she will need to reestablish with her eye specialist, kidney specialist Last A1C here were 9.1 02/2020 and then 9.3 07/2020 Glucometer supplies sent to pharmacy We will need to titrate insulin, she was instructed to return in 1-2 months with all her meds and glucometer With her legal blindness and poor past DM med compliance/f/up/ and inability to titrate insulin - endocrinology referral would be helpful - she may be a good candidate for insulin pump or dexcom - anything to help her improve glycemic control CKD likely from DM and HTN - I will have to review chart and see if we ever tried farxiga or jardiance?

## 2022-02-06 NOTE — Telephone Encounter (Signed)
Patient's daughter called, left VM to return the call to the office. Will need to know exactly which medications her mom needs to be refilled.

## 2022-02-06 NOTE — Assessment & Plan Note (Signed)
Same as above Restart hydralazine 100 mg 3 times daily in addition to all of her other daily home medications

## 2022-02-06 NOTE — Assessment & Plan Note (Signed)
    02/01/2022    3:00 PM 08/04/2020   11:29 AM 04/13/2020    9:04 AM  Depression screen PHQ 2/9  Decreased Interest 0 0 0  Down, Depressed, Hopeless 1 0 0  PHQ - 2 Score 1 0 0  Altered sleeping 0 0   Tired, decreased energy 0 0   Change in appetite 0 0   Feeling bad or failure about yourself  0 0   Trouble concentrating 0 0   Moving slowly or fidgety/restless 0 0   Suicidal thoughts 0 0   PHQ-9 Score 1 0   Difficult doing work/chores Not difficult at all Not difficult at all    PHQ-9 was negative but patient has history of anxiety and depressed mood, previously she had poor compliance with citalopram, multiple recent deaths in family and significant life changes, her family member here describes her as being in a frail emotional state with loss of loved ones, moves/changes/ and her own health-  Plan to restart citalopram start at 10 mg (half a tab) dose for 1 to 2 weeks and then increase to 20 mg and we will recheck very closely in about 6 weeks

## 2022-02-06 NOTE — Telephone Encounter (Signed)
Pt daughter reports pt had appt last week and only 2 of her Rx were sent to the pharmacy. Pt daughter stated that there are 6 or more Rx that pt needs but she could only remember the following:  Medication Refill - Medication: metFORMIN (GLUCOPHAGE) 1000 MG tablet and cloNIDine (CATAPRES) 0.2 MG tablet   Has the patient contacted their pharmacy? No.  Preferred Pharmacy (with phone number or street name):  [facility] - [place], [place] - [address] Phone: [phone]  Fax: [phone]     Has the patient been seen for an appointment in the last year OR does the patient have an upcoming appointment? Yes.    Agent: Please be advised that RX refills may take up to 3 business days. We ask that you follow-up with your pharmacy.

## 2022-02-06 NOTE — Assessment & Plan Note (Signed)
Recheck labs, secondary to kidney disease

## 2022-02-06 NOTE — Assessment & Plan Note (Signed)
Multiple associated comorbidities including uncontrolled insulin-dependent diabetes, chronic kidney disease, uncontrolled and labile hypertension, hyperlipidemia She just returned to [facility]our clinic after moving to a living in [place], there have been multiple significant changes, and uncontrolled problems today so we did not discuss diet and lifestyle efforts She has lost a few pounds compared to her last office visit here May 2022 Resources for diabetes/obesity, registered dietitian would be available to her if interested but she is not interested in doing anything additional today except for reestablishing care and getting medications refilled With poorly controlled diabetes we could possibly add Ozempic or Mounjaro which may also help with weight Wt Readings from Last 5 Encounters:  02/01/22 200 lb 1.6 oz (90.8 kg)  08/04/20 206 lb 11.2 oz (93.8 kg)  02/29/20 209 lb 6.4 oz (95 kg)  02/24/20 208 lb (94.3 kg)  10/11/19 (!) 210 lb 4 oz (95.4 kg)   BMI Readings from Last 5 Encounters:  02/01/22 35.45 kg/m  08/04/20 36.62 kg/m  02/29/20 37.09 kg/m  02/24/20 36.85 kg/m  10/11/19 37.24 kg/m

## 2022-02-06 NOTE — Assessment & Plan Note (Signed)
Patient needs to follow-up with her retinal specialist

## 2022-03-04 ENCOUNTER — Encounter: Payer: Self-pay | Admitting: Family Medicine

## 2022-03-04 ENCOUNTER — Telehealth: Payer: Self-pay | Admitting: Family Medicine

## 2022-03-04 DIAGNOSIS — E1165 Type 2 diabetes mellitus with hyperglycemia: Secondary | ICD-10-CM

## 2022-03-04 NOTE — Telephone Encounter (Unsigned)
Copied from CRM [id number]. Topic: General - Other >> Mar 04, 2022  1:16 PM Ja-Kwan M wrote: Reason for CRM: Pt daughter reports that pt does not have the [facility] monitor and she would like for the Rx to be sent to [facility] - [place], [place] - [address] Phone: [phone]  Fax:[phone]

## 2022-03-05 MED ORDER — FREESTYLE LIBRE 3 SENSOR MISC: 1.0000 | 11 refills | Status: AC

## 2022-03-05 NOTE — Telephone Encounter (Signed)
Pt daughter informed

## 2022-03-15 ENCOUNTER — Ambulatory Visit: Payer: Medicare Other | Admitting: Family Medicine

## 2022-03-25 ENCOUNTER — Ambulatory Visit: Payer: 59 | Admitting: Family Medicine

## 2022-03-29 ENCOUNTER — Ambulatory Visit: Payer: 59 | Admitting: Family Medicine

## 2022-04-01 ENCOUNTER — Encounter: Payer: Self-pay | Admitting: Physician Assistant

## 2022-04-01 ENCOUNTER — Ambulatory Visit (INDEPENDENT_AMBULATORY_CARE_PROVIDER_SITE_OTHER): Payer: 59 | Admitting: Physician Assistant

## 2022-04-01 VITALS — BP 150/82 | HR 79 | Temp 98.1°F | Resp 16 | Ht 63.0 in | Wt 203.7 lb

## 2022-04-01 DIAGNOSIS — E1165 Type 2 diabetes mellitus with hyperglycemia: Secondary | ICD-10-CM

## 2022-04-01 DIAGNOSIS — J3089 Other allergic rhinitis: Secondary | ICD-10-CM

## 2022-04-01 DIAGNOSIS — I1A0 Resistant hypertension: Secondary | ICD-10-CM | POA: Diagnosis not present

## 2022-04-01 DIAGNOSIS — R12 Heartburn: Secondary | ICD-10-CM

## 2022-04-01 DIAGNOSIS — Z794 Long term (current) use of insulin: Secondary | ICD-10-CM

## 2022-04-01 MED ORDER — FLUTICASONE PROPIONATE 50 MCG/ACT NA SUSP: 2.0000 | Freq: Every day | NASAL | 1 refills | Status: AC

## 2022-04-01 MED ORDER — DAPAGLIFLOZIN PROPANEDIOL 5 MG PO TABS: 5.0000 mg | ORAL_TABLET | Freq: Every day | ORAL | 2 refills | Status: DC

## 2022-04-01 MED ORDER — FAMOTIDINE 20 MG PO TABS: 20.0000 mg | ORAL_TABLET | Freq: Every day | ORAL | 1 refills | Status: AC

## 2022-04-01 NOTE — Progress Notes (Signed)
Established Patient Office Visit  Name: Jill Larsen   MRN: [id number]    DOB: 03-10-1951   Date:04/01/2022  Today's Provider: Jacquelin Hawking, MHS, PA-C Introduced myself to the patient as a PA-C and provided education on APPs in clinical practice.         Subjective  Chief Complaint  Chief Complaint  Patient presents with   Follow-up   Hypertension    Pt states has not been monitoring her bp at home    HPI   Diabetes Mellitus Type 2  Most recent A1c : 9.0 in Nov 2023  She reports her blood glucose levels have been in the 200s consistently  She is using a continuous glucose meter- she reports she wants the one that will vocalize to her without using her phone She is using her levemir and taking 20 units in the AM and 25 units in the PM  She reports sometimes in the AM she will wake up around 127, She reports one instance of glucose levels being around 60  She is taking Metformin 1000 mg PO BID  Most recent GFR was 35 - unsure if she has ever been on other medications  She does not think she has ever been on Farxiga   Hypertension: - Medications: Amlodipine 10 mg PO QD, Carvedilol 12.5 mg PO BID, Hydralazine 100 mg PO TID, Irbesartan 300 mg PO QD, Clonidine 0.2 mg PO BID, Chlorthalidone 25 mg PO QD  - Compliance:  - Checking BP at home: Not checking at home. Unsure where her cuff is since she just moved back into town  - Denies any SOB, CP, vision changes, LE edema, medication SEs, or symptoms of hypotension  She reports she has not been taking her Crestor- it was not picked up from Nov     Patient Active Problem List   Diagnosis Date Noted   Heartburn 04/01/2022   Non-seasonal allergic rhinitis 04/01/2022   Major depressive disorder with current active episode 02/06/2022   Chronic kidney disease, stage 3b (HCC) 08/04/2020   Anemia 08/04/2020   Current mild episode of major depressive disorder (HCC) 08/04/2020   Anxiety disorder 08/04/2020   Hyperlipidemia  08/04/2020   Class 2 severe obesity due to excess calories with serious comorbidity and body mass index (BMI) of 36.0 to 36.9 in adult (HCC) 08/04/2020   Uncontrolled diabetes mellitus with hyperglycemia, with long-term current use of insulin (HCC) 04/28/2019   Labile hypertension 04/28/2019   Hypertension    Diabetic macular edema (HCC) 05/21/2018   Nuclear senile cataract 09/08/2017   Nuclear sclerosis 05/05/2017   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) 06/25/2016    Past Surgical History:  Procedure Laterality Date   EYE SURGERY      Family History  Problem Relation Age of Onset   Stroke Mother    Diabetes type II Mother    Stroke Father    Diabetes Mellitus II Brother    Cancer Sister    Breast cancer Sister 56   Diabetes Son    Diabetes Mellitus II Brother    Kidney disease Brother    Prostate cancer Brother     Social History   Tobacco Use   Smoking status: Never   Smokeless tobacco: Never  Substance Use Topics   Alcohol use: Never     Current Outpatient Medications:    amLODipine (NORVASC) 10 MG tablet, Take 1 tablet (10 mg total) by  mouth daily., Disp: 90 tablet, Rfl: 1   blood glucose meter kit and supplies KIT, Dispense based on patient and insurance preference. Use up to four times daily as directed. (FOR E11.65, Z79.4), Disp: 1 each, Rfl: 0   brimonidine (ALPHAGAN) 0.2 % ophthalmic solution, Place 1 drop into the right eye 2 (two) times daily., Disp: , Rfl:    carvedilol (COREG) 12.5 MG tablet, Take 1 tablet (12.5 mg total) by mouth 2 (two) times daily., Disp: 180 tablet, Rfl: 1   chlorthalidone (HYGROTON) 25 MG tablet, Take 1 tablet (25 mg total) by mouth daily., Disp: 90 tablet, Rfl: 1   citalopram (CELEXA) 20 MG tablet, Take 1 tablet (20 mg total) by mouth daily. Restart medicine 1/2 tab once daily x 2 weeks then increase to full 1 tab 20 mg dose once daily, Disp: 90 tablet, Rfl: 3   cloNIDine (CATAPRES) 0.2 MG  tablet, Take 1 tablet (0.2 mg total) by mouth 2 (two) times daily., Disp: 180 tablet, Rfl: 1   Continuous Blood Gluc Sensor (FREESTYLE LIBRE 3 SENSOR) MISC, 1 Device by Does not apply route every 14 (fourteen) days., Disp: 2 each, Rfl: 2   Continuous Blood Gluc Sensor (FREESTYLE LIBRE 3 SENSOR) MISC, 1 Device by Does not apply route every 14 (fourteen) days., Disp: 2 each, Rfl: 11   dapagliflozin propanediol (FARXIGA) 5 MG TABS tablet, Take 1 tablet (5 mg total) by mouth daily before breakfast., Disp: 30 tablet, Rfl: 2   famotidine (PEPCID) 20 MG tablet, Take 1 tablet (20 mg total) by mouth daily., Disp: 30 tablet, Rfl: 1   fluticasone (FLONASE) 50 MCG/ACT nasal spray, Place 2 sprays into both nostrils daily., Disp: 15.8 mL, Rfl: 1   Glucose Blood (BLOOD GLUCOSE TEST STRIPS) STRP, Use as directed to monitor FSBS at least once daily. Dx: E11.65., Disp: 100 strip, Rfl: 11   hydrALAZINE (APRESOLINE) 100 MG tablet, Take 1 tablet (100 mg total) by mouth 3 (three) times daily., Disp: 270 tablet, Rfl: 1   insulin detemir (LEVEMIR) 100 UNIT/ML FlexPen, Inject 20-40 Units into the skin 2 (two) times daily. Per instruction from PCP/PA, Disp: 45 mL, Rfl: 0   Insulin Pen Needle 32G X 4 MM MISC, Use as directed with insulin BID, Disp: 200 each, Rfl: 3   irbesartan (AVAPRO) 300 MG tablet, Take 1 tablet (300 mg total) by mouth daily., Disp: 90 tablet, Rfl: 1   Lancets MISC, Use as directed to monitor FSBS at least once daily for IDDM, uncontrolled. Dx: E11.65., Disp: 200 each, Rfl: 11   metFORMIN (GLUCOPHAGE) 1000 MG tablet, Take 1 tablet (1,000 mg total) by mouth 2 (two) times daily., Disp: 180 tablet, Rfl: 1   prednisoLONE acetate (PRED FORTE) 1 % ophthalmic suspension, Place 1 drop into the right eye 3 (three) times daily., Disp: , Rfl:    rosuvastatin (CRESTOR) 10 MG tablet, Take 1 tablet (10 mg total) by mouth daily., Disp: 90 tablet, Rfl: 3  No Known Allergies  I personally reviewed active problem list,  medication list, allergies, notes from last encounter, lab results with the patient/caregiver today.   Review of Systems  HENT:         Runny nose    Eyes:  Negative for blurred vision and double vision.  Respiratory:  Negative for shortness of breath and wheezing.   Cardiovascular:  Negative for chest pain, palpitations and leg swelling.  Gastrointestinal:  Positive for heartburn.  Neurological:  Negative for dizziness and headaches.  Objective  Vitals:   04/01/22 1419  BP: (!) 150/82  Pulse: 79  Resp: 16  Temp: 98.1 F (36.7 C)  TempSrc: Oral  SpO2: 96%  Weight: 203 lb 11.2 oz (92.4 kg)  Height: 5\' 3"  (1.6 m)    Body mass index is 36.08 kg/m.  Physical Exam Vitals reviewed.  Constitutional:      General: She is awake.     Appearance: Normal appearance. She is well-developed and well-groomed.  HENT:     Head: Normocephalic and atraumatic.  Cardiovascular:     Rate and Rhythm: Normal rate and regular rhythm.     Pulses: Normal pulses.          Radial pulses are 2+ on the right side and 2+ on the left side.     Heart sounds: Normal heart sounds. No murmur heard.    No friction rub. No gallop.  Pulmonary:     Effort: Pulmonary effort is normal.     Breath sounds: Normal breath sounds. No decreased air movement. No decreased breath sounds, wheezing, rhonchi or rales.  Musculoskeletal:     Right lower leg: No edema.     Left lower leg: No edema.  Skin:    General: Skin is warm.     Capillary Refill: Capillary refill takes less than 2 seconds.  Neurological:     General: No focal deficit present.     Mental Status: She is alert and oriented to person, place, and time. Mental status is at baseline.  Psychiatric:        Mood and Affect: Mood normal.        Behavior: Behavior normal. Behavior is cooperative.        Thought Content: Thought content normal.        Judgment: Judgment normal.      Recent Results (from the past 2160 hour(s))  COMPLETE  METABOLIC PANEL WITH GFR     Status: Abnormal   Collection Time: 02/01/22  3:37 PM  Result Value Ref Range   Glucose, Bld 150 (H) 65 - 99 mg/dL    Comment: .            Fasting reference interval . For someone without known diabetes, a glucose value >125 mg/dL indicates that they may have diabetes and this should be confirmed with a follow-up test. .    BUN 32 (H) 7 - 25 mg/dL   Creat 02/03/22 (H) 7.98 - 1.05 mg/dL   eGFR 35 (L) > OR = 60 mL/min/1.56m2   BUN/Creatinine Ratio 20 6 - 22 (calc)   Sodium 139 135 - 146 mmol/L   Potassium 4.3 3.5 - 5.3 mmol/L   Chloride 102 98 - 110 mmol/L   CO2 27 20 - 32 mmol/L   Calcium 9.5 8.6 - 10.4 mg/dL   Total Protein 7.5 6.1 - 8.1 g/dL   Albumin 4.3 3.6 - 5.1 g/dL   Globulin 3.2 1.9 - 3.7 g/dL (calc)   AG Ratio 1.3 1.0 - 2.5 (calc)   Total Bilirubin 0.4 0.2 - 1.2 mg/dL   Alkaline phosphatase (APISO) 64 37 - 153 U/L   AST 9 (L) 10 - 35 U/L   ALT 7 6 - 29 U/L  CBC with Differential/Platelet     Status: Abnormal   Collection Time: 02/01/22  3:37 PM  Result Value Ref Range   WBC 7.3 3.8 - 10.8 Thousand/uL   RBC 4.02 3.80 - 5.10 Million/uL   Hemoglobin 10.6 (L) 11.7 - 15.5 g/dL  HCT 32.3 (L) 35.0 - 45.0 %   MCV 80.3 80.0 - 100.0 fL   MCH 26.4 (L) 27.0 - 33.0 pg   MCHC 32.8 32.0 - 36.0 g/dL   RDW 13.013.2 86.511.0 - 78.415.0 %   Platelets 266 140 - 400 Thousand/uL   MPV 11.5 7.5 - 12.5 fL   Neutro Abs 3,898 1,500 - 7,800 cells/uL   Lymphs Abs 2,424 850 - 3,900 cells/uL   Absolute Monocytes 745 200 - 950 cells/uL   Eosinophils Absolute 161 15 - 500 cells/uL   Basophils Absolute 73 0 - 200 cells/uL   Neutrophils Relative % 53.4 %   Total Lymphocyte 33.2 %   Monocytes Relative 10.2 %   Eosinophils Relative 2.2 %   Basophils Relative 1.0 %  Lipid panel     Status: None   Collection Time: 02/01/22  3:37 PM  Result Value Ref Range   Cholesterol 117 <200 mg/dL   HDL 57 > OR = 50 mg/dL   Triglycerides 84 <696<150 mg/dL   LDL Cholesterol (Calc) 44 mg/dL  (calc)    Comment: Reference range: <100 . Desirable range <100 mg/dL for primary prevention;   <70 mg/dL for patients with CHD or diabetic patients  with > or = 2 CHD risk factors. Marland Kitchen. LDL-C is now calculated using the Martin-Hopkins  calculation, which is a validated novel method providing  better accuracy than the Friedewald equation in the  estimation of LDL-C.  Horald PollenMartin SS et al. Lenox AhrJAMA. 2952;841(322013;310(19): 2061-2068  (http://education.[facility].com/faq/FAQ164)    Total CHOL/HDL Ratio 2.1 <5.0 (calc)   Non-HDL Cholesterol (Calc) 60 <440<130 mg/dL (calc)    Comment: For patients with diabetes plus 1 major ASCVD risk  factor, treating to a non-HDL-C goal of <100 mg/dL  (LDL-C of <10<70 mg/dL) is considered a therapeutic  option.   Microalbumin / creatinine urine ratio     Status: None   Collection Time: 02/01/22  3:37 PM  Result Value Ref Range   Creatinine, Urine 138 20 - 275 mg/dL   Microalb, Ur 3.8 mg/dL    Comment: Reference Range Not established    Microalb Creat Ratio 28 <30 mcg/mg creat    Comment: . The ADA defines abnormalities in albumin excretion as follows: Marland Kitchen. Albuminuria Category        Result (mcg/mg creatinine) . Normal to Mildly increased   <30 Moderately increased         30-299  Severely increased           > OR = 300 . The ADA recommends that at least two of three specimens collected within a 3-6 month period be abnormal before considering a patient to be within a diagnostic category.   Hemoglobin A1c     Status: Abnormal   Collection Time: 02/01/22  3:37 PM  Result Value Ref Range   Hgb A1c MFr Bld 9.0 (H) <5.7 % of total Hgb    Comment: For someone without known diabetes, a hemoglobin A1c value of 6.5% or greater indicates that they may have  diabetes and this should be confirmed with a follow-up  test. . For someone with known diabetes, a value <7% indicates  that their diabetes is well controlled and a value  greater than or equal to 7% indicates  suboptimal  control. A1c targets should be individualized based on  duration of diabetes, age, comorbid conditions, and  other considerations. . Currently, no consensus exists regarding use of hemoglobin A1c for diagnosis of diabetes for children. .Marland Kitchen  Mean Plasma Glucose 212 mg/dL   eAG (mmol/L) 48.5 mmol/L     PHQ2/9:    04/01/2022    2:19 PM 02/01/2022    3:00 PM 08/04/2020   11:29 AM 04/13/2020    9:04 AM 02/29/2020   11:07 AM  Depression screen PHQ 2/9  Decreased Interest 0 0 0 0 0  Down, Depressed, Hopeless 0 1 0 0 0  PHQ - 2 Score 0 1 0 0 0  Altered sleeping 0 0 0    Tired, decreased energy 0 0 0    Change in appetite 0 0 0    Feeling bad or failure about yourself  0 0 0    Trouble concentrating 0 0 0    Moving slowly or fidgety/restless 0 0 0    Suicidal thoughts 0 0 0    PHQ-9 Score 0 1 0    Difficult doing work/chores Not difficult at all Not difficult at all Not difficult at all        Fall Risk:    04/01/2022    2:19 PM 02/01/2022    3:00 PM 08/04/2020   11:28 AM 04/13/2020    9:06 AM 02/29/2020   11:06 AM  Fall Risk   Falls in the past year? 0 0 0 0 0  Number falls in past yr: 0 0 0 0 0  Injury with Fall? 0 0 0 0 0  Risk for fall due to : No Fall Risks No Fall Risks  Impaired vision   Follow up Falls prevention discussed;Education provided;Falls evaluation completed Falls prevention discussed;Education provided;Falls evaluation completed Falls evaluation completed Falls prevention discussed       Functional Status Survey: Is the patient deaf or have difficulty hearing?: No Does the patient have difficulty seeing, even when wearing glasses/contacts?: Yes Does the patient have difficulty concentrating, remembering, or making decisions?: Yes Does the patient have difficulty walking or climbing stairs?: No Does the patient have difficulty dressing or bathing?: No Does the patient have difficulty doing errands alone such as visiting a doctor's office or  shopping?: Yes    Assessment & Plan  Problem List Items Addressed This Visit       Cardiovascular and Mediastinum   Hypertension    Chronic, likely uncontrolled  Her medication list includes the following regimen  Amlodipine 10 mg PO QD, Carvedilol 12.5 mg PO BID, Hydralazine 100 mg PO TID, Irbesartan 300 mg PO QD, Clonidine 0.2 mg PO BID, Chlorthalidone 25 mg PO QD and she reports good compliance with it BP is still elevated in office today- unsure of home readings  Recommend she check daily at home and record so we can see how it is outside of office Will place referral to Cardiology for assistance with management given resistant nature and polypharmacy Follow up in 6 weeks for monitoring        Relevant Orders   Ambulatory referral to Cardiology     Respiratory   Non-seasonal allergic rhinitis    She reports chronic condition  States she previously took Flonase and needs a refill for this Refills provided Follow up as needed        Relevant Medications   fluticasone (FLONASE) 50 MCG/ACT nasal spray     Endocrine   Uncontrolled diabetes mellitus with hyperglycemia, with long-term current use of insulin (HCC) - Primary    Chart review demonstrates chronic condition, unstable, and uncontrolled She is taking Metformin 1000 mg PO BID and levemir 20 units AM and 25  units PM She reports her meter reports consistent glucose readings in the 200s  Most recent A1c was 9.0 in Nov - too early to recheck today Recommend starting Farxiga 5 mg PO QD at this time to provide some glycemic control and kidney protection Follow up in about 6 weeks to recheck A1c  Continue Metformin and Levemir- will also place referral to Endo for evaluation and management assistance        Relevant Medications   dapagliflozin propanediol (FARXIGA) 5 MG TABS tablet   Other Relevant Orders   Ambulatory referral to Endocrinology     Other   Heartburn    Unsure of chronicity but she states she was  taking Famotidine PO QD for management and reports good control Will send in script for Famotidine 20 mg PO QD for symptom management Would also recommend dietary changes and exercise as tolerable for further management Follow up as needed       Relevant Medications   famotidine (PEPCID) 20 MG tablet     Return in about 6 weeks (around 05/13/2022) for Diabetes follow up, HTN.   I, Dakisha Schoof E Raygan Skarda, PA-C, have reviewed all documentation for this visit. The documentation on 04/01/22 for the exam, diagnosis, procedures, and orders are all accurate and complete.   Talitha Givens, MHS, PA-C [facility]

## 2022-04-01 NOTE — Assessment & Plan Note (Addendum)
She reports chronic condition  States she previously took [facility] and needs a refill for this Refills provided Follow up as needed

## 2022-04-01 NOTE — Patient Instructions (Addendum)
Please check your Blood pressure daily at home and record the results Please be on the look out for a phone call to schedule this apt   Please start taking the Farxiga 5 mg by mouth once per day Continue taking your insulin and Metformin at the current doses  Continue using your glucose meter as directed   Please follow up in the office at the beginning of March so we can recheck your labs

## 2022-04-01 NOTE — Assessment & Plan Note (Signed)
Chronic, likely uncontrolled  Her medication list includes the following regimen  Amlodipine 10 mg PO QD, Carvedilol 12.5 mg PO BID, Hydralazine 100 mg PO TID, Irbesartan 300 mg PO QD, Clonidine 0.2 mg PO BID, Chlorthalidone 25 mg PO QD and she reports good compliance with it BP is still elevated in office today- unsure of home readings  Recommend she check daily at home and record so we can see how it is outside of office Will place referral to Cardiology for assistance with management given resistant nature and polypharmacy Follow up in 6 weeks for monitoring

## 2022-04-01 NOTE — Assessment & Plan Note (Signed)
Unsure of chronicity but she states she was taking Famotidine PO QD for management and reports good control Will send in script for Famotidine 20 mg PO QD for symptom management Would also recommend dietary changes and exercise as tolerable for further management Follow up as needed

## 2022-04-01 NOTE — Assessment & Plan Note (Signed)
Chart review demonstrates chronic condition, unstable, and uncontrolled She is taking Metformin 1000 mg PO BID and levemir 20 units AM and 25 units PM She reports her meter reports consistent glucose readings in the 200s  Most recent A1c was 9.0 in Nov - too early to recheck today Recommend starting Farxiga 5 mg PO QD at this time to provide some glycemic control and kidney protection Follow up in about 6 weeks to recheck A1c  Continue Metformin and Levemir- will also place referral to Endo for evaluation and management assistance

## 2022-04-11 ENCOUNTER — Telehealth: Payer: Self-pay | Admitting: Family Medicine

## 2022-04-11 DIAGNOSIS — E782 Mixed hyperlipidemia: Secondary | ICD-10-CM

## 2022-04-11 DIAGNOSIS — E1165 Type 2 diabetes mellitus with hyperglycemia: Secondary | ICD-10-CM

## 2022-04-11 NOTE — Telephone Encounter (Signed)
Patient's daughter called in states patient discussed cholesterol med with Dr Bonnye Fava, but nothing at pharmacy for it. She doesn't know the name of it, also states needs insuline pin needle 22 gram. I'm only showing 32 gm  Please call back

## 2022-04-11 NOTE — Telephone Encounter (Signed)
Spoke to patient daughter she states pt never received her HLD medication. Can you please send prescription to:  [facility] [place], [place] [address]   I do see Kristeen Miss previously prescribed to patient Insulin Pen Needle 32G X 4 MM pt daughter mentioned 22G. She will confimed with her mom to verify which one she needs [facility]

## 2022-04-15 MED ORDER — ROSUVASTATIN CALCIUM 10 MG PO TABS: 10.0000 mg | ORAL_TABLET | Freq: Every day | ORAL | 1 refills | Status: AC

## 2022-04-15 NOTE — Telephone Encounter (Signed)
Pt daughter verbalized understanding

## 2022-05-23 ENCOUNTER — Other Ambulatory Visit: Payer: Self-pay | Admitting: Family Medicine

## 2022-05-23 DIAGNOSIS — E1165 Type 2 diabetes mellitus with hyperglycemia: Secondary | ICD-10-CM

## 2022-06-05 IMAGING — MG MM DIGITAL SCREENING BILAT W/ TOMO AND CAD
6 of 12 series · 6 of 36 positions shown · non-contrast
Comparison: Previous exam(s).

CLINICAL DATA: Screening.

EXAM:
DIGITAL SCREENING BILATERAL MAMMOGRAM WITH TOMOSYNTHESIS AND CAD
TECHNIQUE: Bilateral screening digital craniocaudal and mediolateral oblique
mammograms were obtained. Bilateral screening digital breast
tomosynthesis was performed. The images were evaluated with
computer-aided detection.

[R MLO synth-2D (1 of 2)]
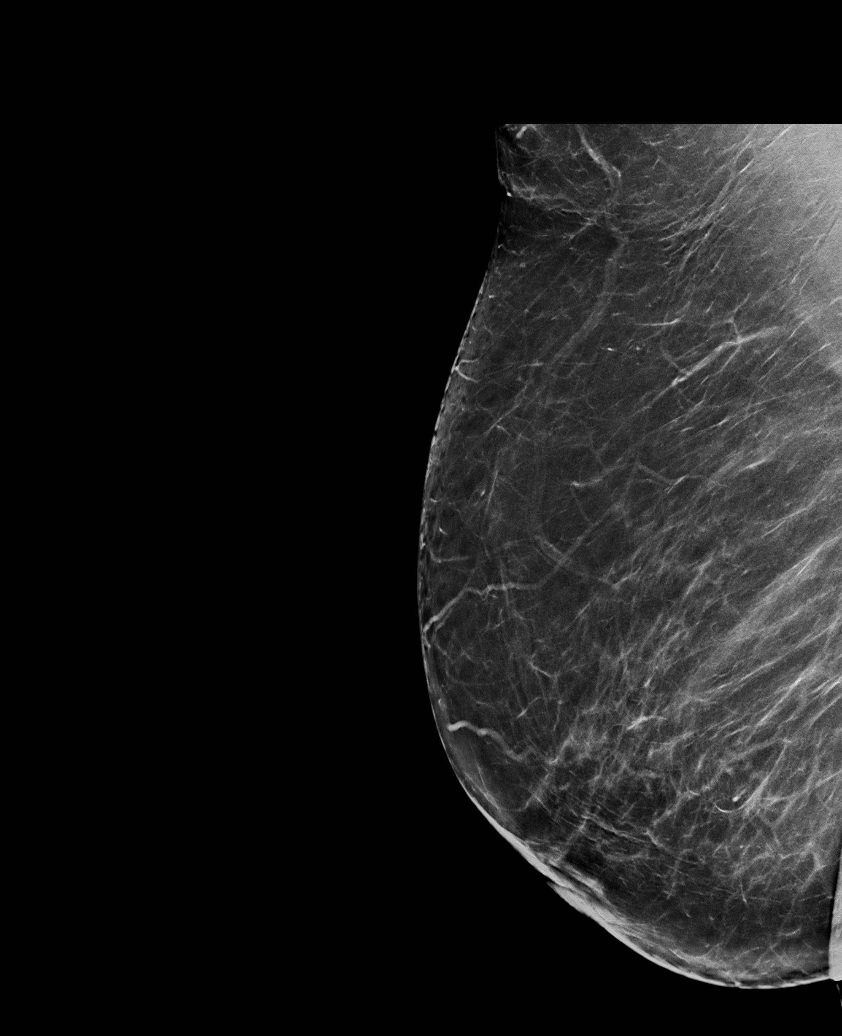

[L MLO synth-2D]
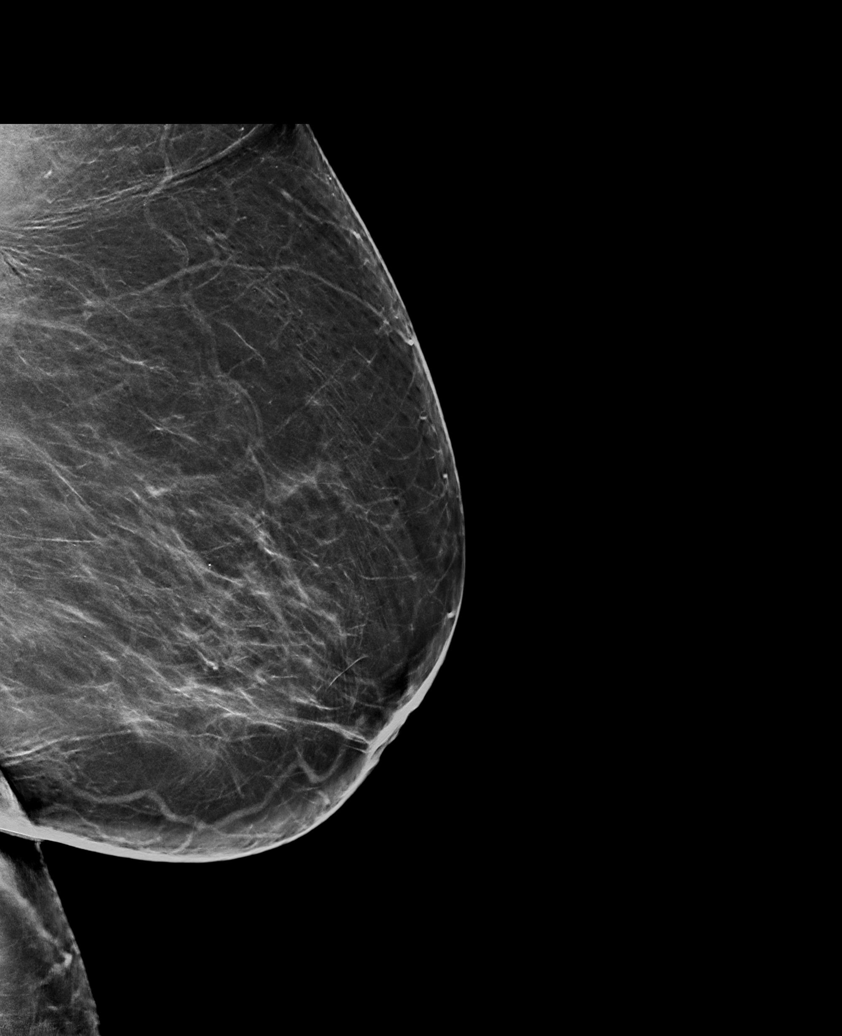

[L CC synth-2D]
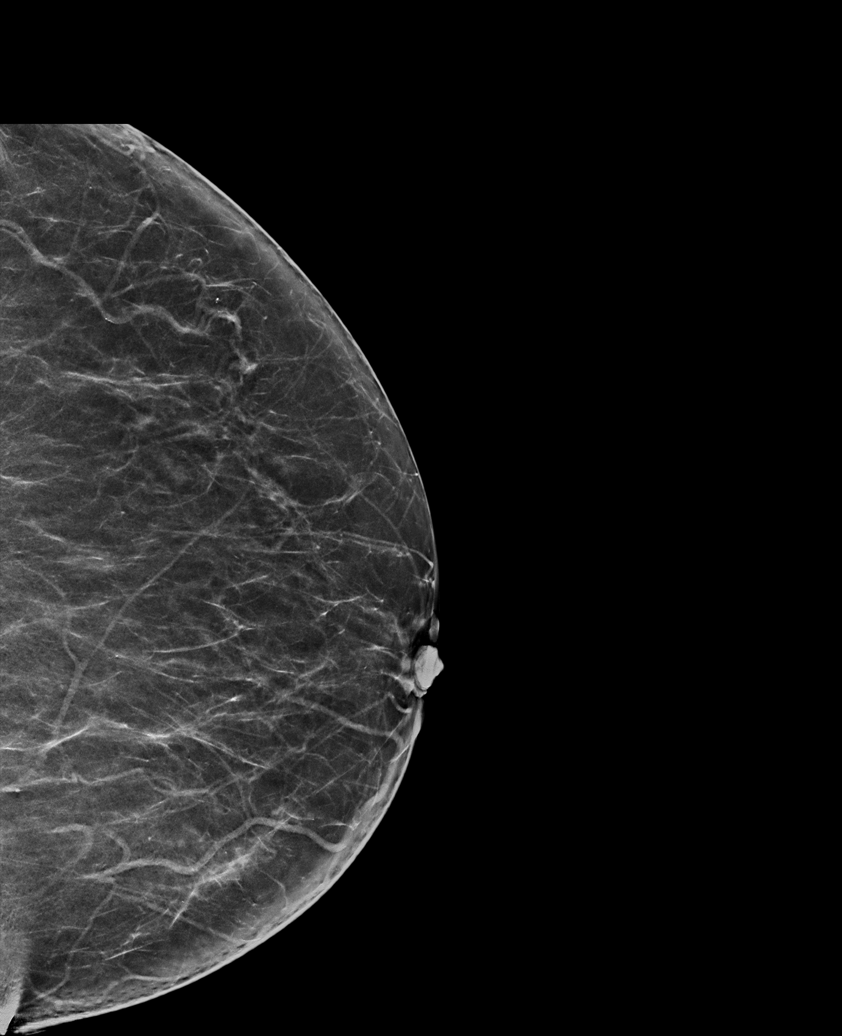

[R CC synth-2D]
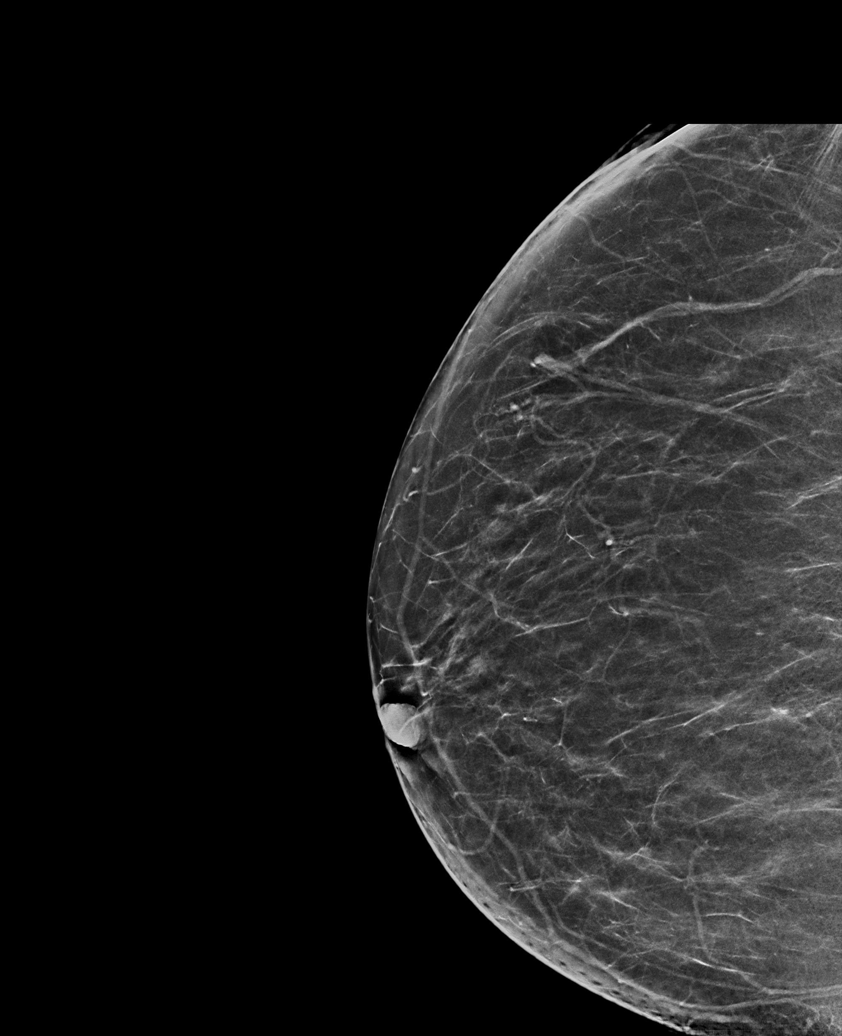

[R MLO synth-2D (2 of 2)]
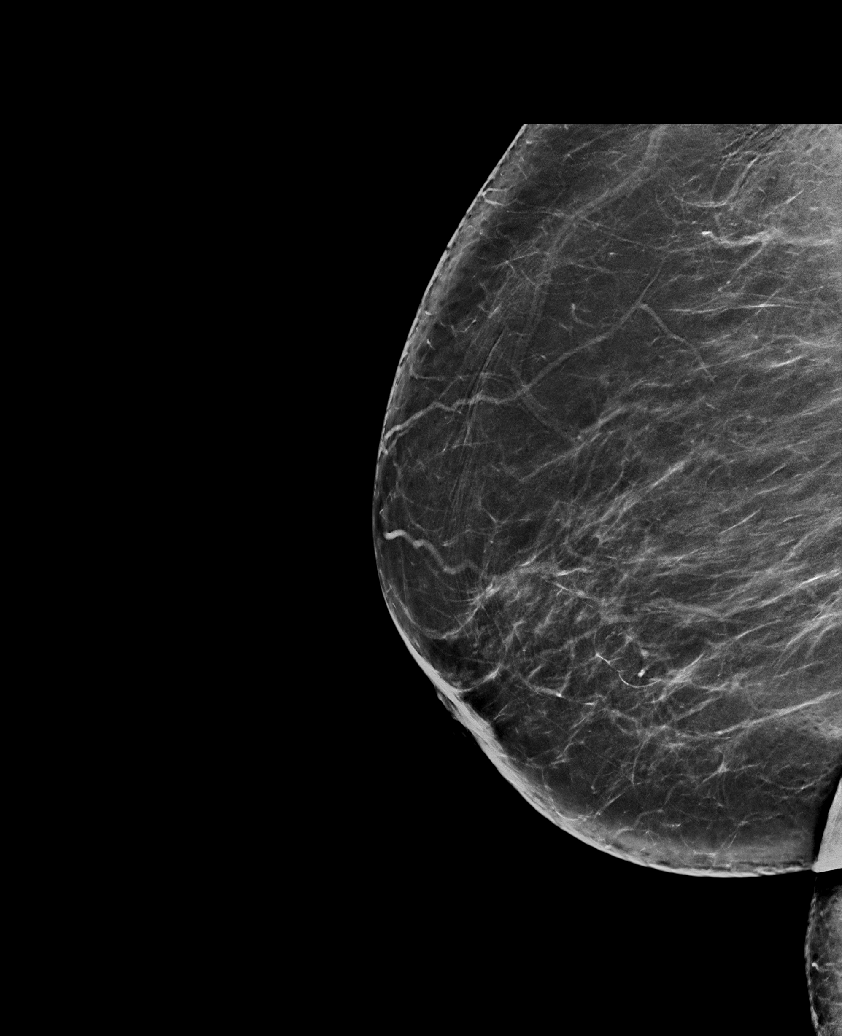

[R CV synth-2D]
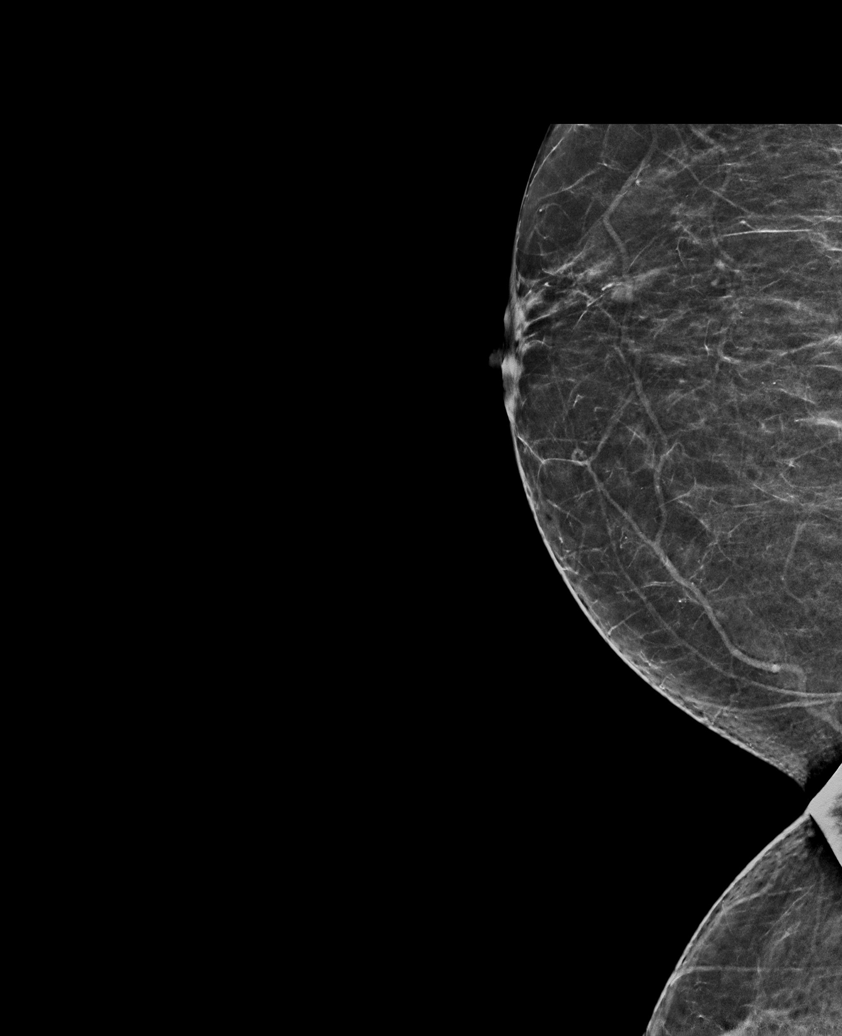

[6 of 36 positions shown; findings below may reference images not displayed]

ACR Breast Density Category b: There are scattered areas of
fibroglandular density.
FINDINGS: There are no findings suspicious for malignancy. The images were
evaluated with computer-aided detection.
IMPRESSION: No mammographic evidence of malignancy. A result letter of this
screening mammogram will be mailed directly to the patient.

RECOMMENDATION:
Screening mammogram in one year. (Code:[id number]-[id number])

BI-RADS CATEGORY  1: Negative.

## 2022-06-28 ENCOUNTER — Other Ambulatory Visit: Payer: Self-pay

## 2022-06-28 DIAGNOSIS — E1165 Type 2 diabetes mellitus with hyperglycemia: Secondary | ICD-10-CM

## 2022-06-28 MED ORDER — DAPAGLIFLOZIN PROPANEDIOL 5 MG PO TABS
5.0000 mg | ORAL_TABLET | Freq: Every day | ORAL | 1 refills | Status: AC
Start: 2022-06-28 — End: ?

## 2022-07-22 ENCOUNTER — Telehealth: Payer: Self-pay | Admitting: Family Medicine

## 2022-07-22 NOTE — Telephone Encounter (Signed)
Called patient to schedule Medicare Annual Wellness Visit (AWV). Unable to reach patient.  Last date of AWV: 04/13/2021  Please schedule an appointment at any time with [facility].  If any questions, please contact me at [phone].  Thank you ,  Randon Goldsmith Care Guide [facility] AWV TEAM Direct Dial: [phone]

## 2022-10-23 ENCOUNTER — Encounter: Payer: Self-pay | Admitting: Family Medicine

## 2023-02-25 ENCOUNTER — Other Ambulatory Visit: Payer: Self-pay | Admitting: Family Medicine

## 2023-02-25 DIAGNOSIS — R0989 Other specified symptoms and signs involving the circulatory and respiratory systems: Secondary | ICD-10-CM

## 2023-02-25 DIAGNOSIS — I10 Essential (primary) hypertension: Secondary | ICD-10-CM

## 2023-03-17 ENCOUNTER — Other Ambulatory Visit: Payer: Self-pay | Admitting: Family Medicine

## 2023-03-17 DIAGNOSIS — I10 Essential (primary) hypertension: Secondary | ICD-10-CM

## 2023-03-17 DIAGNOSIS — R0989 Other specified symptoms and signs involving the circulatory and respiratory systems: Secondary | ICD-10-CM

## 2023-03-17 NOTE — Telephone Encounter (Signed)
Not able to leave voicemail. The phone only rung. Pt need to schedule appt

## 2023-03-17 NOTE — Telephone Encounter (Signed)
Needs appt
